# Patient Record
Sex: Male | Born: 1955 | Race: White | Hispanic: No | Marital: Married | Smoking: Never smoker
Health system: Southern US, Community
[De-identification: ages and names within clinical notes are randomized; demographics above are authoritative.]

## PROBLEM LIST (undated history)

## (undated) DIAGNOSIS — K9041 Non-celiac gluten sensitivity: Secondary | ICD-10-CM

## (undated) DIAGNOSIS — T7840XA Allergy, unspecified, initial encounter: Secondary | ICD-10-CM

## (undated) HISTORY — DX: Allergy, unspecified, initial encounter: T78.40XA

## (undated) HISTORY — PX: INGUINAL HERNIA REPAIR: SHX194

## (undated) HISTORY — DX: Non-celiac gluten sensitivity: K90.41

---

## 2005-09-24 ENCOUNTER — Ambulatory Visit: Payer: Self-pay | Admitting: Family Medicine

## 2006-01-13 ENCOUNTER — Ambulatory Visit: Payer: Self-pay | Admitting: Family Medicine

## 2006-07-13 ENCOUNTER — Ambulatory Visit: Payer: Self-pay | Admitting: Family Medicine

## 2006-07-20 ENCOUNTER — Ambulatory Visit: Payer: Self-pay | Admitting: Internal Medicine

## 2006-08-02 DIAGNOSIS — R51 Headache: Secondary | ICD-10-CM

## 2006-08-02 DIAGNOSIS — J309 Allergic rhinitis, unspecified: Secondary | ICD-10-CM

## 2006-08-02 DIAGNOSIS — R519 Headache, unspecified: Secondary | ICD-10-CM | POA: Insufficient documentation

## 2006-08-15 ENCOUNTER — Encounter: Payer: Self-pay | Admitting: Family Medicine

## 2006-08-15 ENCOUNTER — Ambulatory Visit: Payer: Self-pay | Admitting: Family Medicine

## 2006-08-15 DIAGNOSIS — J019 Acute sinusitis, unspecified: Secondary | ICD-10-CM

## 2006-08-22 ENCOUNTER — Ambulatory Visit: Payer: Self-pay | Admitting: Internal Medicine

## 2006-08-22 ENCOUNTER — Encounter (INDEPENDENT_AMBULATORY_CARE_PROVIDER_SITE_OTHER): Payer: Self-pay | Admitting: *Deleted

## 2006-08-22 LAB — HM COLONOSCOPY

## 2006-09-05 ENCOUNTER — Encounter: Payer: Self-pay | Admitting: Family Medicine

## 2006-09-05 ENCOUNTER — Ambulatory Visit: Payer: Self-pay | Admitting: Family Medicine

## 2006-09-05 DIAGNOSIS — L272 Dermatitis due to ingested food: Secondary | ICD-10-CM | POA: Insufficient documentation

## 2006-09-05 DIAGNOSIS — R1013 Epigastric pain: Secondary | ICD-10-CM

## 2006-09-05 DIAGNOSIS — K3189 Other diseases of stomach and duodenum: Secondary | ICD-10-CM

## 2006-09-07 ENCOUNTER — Encounter: Payer: Self-pay | Admitting: Family Medicine

## 2007-03-09 ENCOUNTER — Encounter: Payer: Self-pay | Admitting: Family Medicine

## 2007-06-19 ENCOUNTER — Ambulatory Visit: Payer: Self-pay | Admitting: Family Medicine

## 2007-06-19 DIAGNOSIS — K4090 Unilateral inguinal hernia, without obstruction or gangrene, not specified as recurrent: Secondary | ICD-10-CM | POA: Insufficient documentation

## 2007-08-11 ENCOUNTER — Encounter: Payer: Self-pay | Admitting: Family Medicine

## 2007-08-31 ENCOUNTER — Ambulatory Visit: Payer: Self-pay | Admitting: Family Medicine

## 2007-09-14 ENCOUNTER — Encounter: Admission: RE | Admit: 2007-09-14 | Discharge: 2007-09-14 | Payer: Self-pay | Admitting: General Surgery

## 2007-09-19 ENCOUNTER — Encounter: Payer: Self-pay | Admitting: Family Medicine

## 2007-10-12 ENCOUNTER — Encounter: Payer: Self-pay | Admitting: Family Medicine

## 2007-10-13 ENCOUNTER — Ambulatory Visit: Payer: Self-pay | Admitting: Family Medicine

## 2008-08-16 ENCOUNTER — Ambulatory Visit: Payer: Self-pay | Admitting: Family Medicine

## 2008-10-15 ENCOUNTER — Encounter: Payer: Self-pay | Admitting: Family Medicine

## 2009-09-03 ENCOUNTER — Ambulatory Visit: Payer: Self-pay | Admitting: Family Medicine

## 2010-02-09 ENCOUNTER — Encounter: Payer: Self-pay | Admitting: Family Medicine

## 2010-11-24 NOTE — Letter (Signed)
Summary: [facility]   Imported By: Lanelle Bal 02/27/2010 13:52:07  _____________________________________________________________________  External Attachment:    Type:   Image     Comment:   External Document

## 2011-03-12 NOTE — Assessment & Plan Note (Signed)
[facility]                     [place] [facility] OFFICE NOTE   DELANE, WESSINGER               MRN:          [id number]  DATE:07/20/2006                            DOB:          02/17/1956    CHIEF COMPLAINT:  Rectal bleeding.   ASSESSMENT:  13. A 55 year old white man with chronic, recurrent rectal bleeding and      hematochezia.  2. Recent problems with epigastric pain improved on Protonix.  There is      one episode of dysphagia.  3. Weight loss 16 pounds over the last several months, probably      multifactorial by with the gastrointestinal symptoms gastrointestinal      malignancy must be considered.   PLAN:  Schedule upper GI endoscopy with possible esophageal dilation as well  as colonoscopy to investigate these problems and determine a cause.  He is  to continue his short course of Protonix for the time being.  If he finds  that he requires continued proton pump inhibitor before his procedures, he  can use Prilosec OTC.   Risks, benefits, and indications of the procedure explained to the patient.  He understands and agrees to proceed.   HISTORY:  This is a 55 year old white man that says for the past two to  three years he has had intermittent blood on the toilet paper and into the  toilet bowl or with the stool.  It is an intermittent thing.  He cannot  correlate it with any particular problems with his bowels as he has none.  He does think he has hemorrhoids as he feels irritated in the anal area at  times.   Over the summer, he developed a syndrome of nausea, some fever what he  thought was a viral illness.  He had a lot of epigastric burning.  The  fevers abated.  He felt somewhat better but he had persistent  gastrointestinal symptoms.  He went to see Dr. Cathey Endow and was given Protonix  and felt much better within days.  He had a spell of dysphagia within the  past month where he was eating a breakfast  sandwich and it stopped in his  suprasternal area and he could not get it up or down but eventually it  passed.  He does not have chronic heartburn or dysphagia.  He has lost 16  pounds as noted.  There is no significant change in appetite.   PAST MEDICAL HISTORY:  1. Allergic rhinosinusitis.  2. Chronic headaches.   FAMILY HISTORY:  Father had heart disease in his 33s.  No colon cancer  reported.   SOCIAL HISTORY:  He is married.  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  Two sons, lives with his wife  and sons.  Social history also is important for that he chews tobacco.  He  is counseled to quit and he has done this intermittently for years.   MEDICATIONS:  1. Protonix 40 mg daily.  2. Nasonex daily.  3. Antihistamine nonsedating medication daily, he cannot remember the      name.  4. Tylenol as needed.  ALLERGIES:  None known.   REVIEW OF SYSTEMS:  As above.  Some low back pain and muscle cramps as well.  All other systems are negative.   PHYSICAL EXAMINATION:  Well-developed, well-nourished white man.  Height 6  feet 3.  Weight 212 pounds.  Blood pressure 121/69, pulse 81.  HEENT:  Eyes:  Anicteric.  ENT shows partial denture, otherwise free of  lesions.  NECK:  Supple.  No thyromegaly or mass.  CHEST:  Clear.  HEART:  S1-S2.  No gallops.  ABDOMEN:  Soft and nontender.  No organomegaly or mass.  RECTAL:  Deferred to colonoscopy.  EXTREMITIES:  No edema.  SKIN:  No rash.  NEUROLOGICAL:  He is alert and oriented x3.   I have ordered a CBC and CMET in this man as well.  Further plans pending  the above.   I appreciate the opportunity to care for this patient.       Iva Boop, MD,FACG      CEG/MedQ  DD:  07/20/2006  DT:  07/21/2006  Job #:  [id number]   cc:   Seymour Bars, D.O.

## 2011-04-12 ENCOUNTER — Encounter: Payer: Self-pay | Admitting: Family Medicine

## 2011-04-13 ENCOUNTER — Encounter: Payer: Self-pay | Admitting: Family Medicine

## 2011-04-13 ENCOUNTER — Ambulatory Visit (INDEPENDENT_AMBULATORY_CARE_PROVIDER_SITE_OTHER): Payer: Self-pay | Admitting: Family Medicine

## 2011-04-13 DIAGNOSIS — E039 Hypothyroidism, unspecified: Secondary | ICD-10-CM | POA: Insufficient documentation

## 2011-04-13 DIAGNOSIS — R5383 Other fatigue: Secondary | ICD-10-CM

## 2011-04-13 DIAGNOSIS — R0602 Shortness of breath: Secondary | ICD-10-CM

## 2011-04-13 NOTE — Progress Notes (Signed)
Subjective:    Patient ID: Drew Sutton, male    DOB: [birth year], 55 y.o.   MRN: [id number]  HPI Was seing a holistic doctor and has been on his thyroid med for the last 2 years.  Has been on the same regimen for 2 years. Has been feeling more fatigued lateley. He does work outdoors for about 11 hours a day.  Feels tired and worn out all the time. Mom with a history of diabets. Work has been stressful. Having a hard time getting out of bed.  Feels down. Sleeping OK.  There he says he sleeps a lot on the weekends because he tried to catch up from the week because he feels so exhausted. He has diffuse joint aches. No blood in the urine or stool. Never been a smoker.  Did work in a coal mine early in life. His wife is here with him today.   Review of Systems  BP 117/76  Pulse 82  Ht 6' 0.5" (1.842 m)  Wt 226 lb (102.513 kg)  BMI 30.23 kg/m2    No Known Allergies  Past Medical History  Diagnosis Date  . Gluten enteropathy   . Allergy     vingear    History reviewed. No pertinent past surgical history.  History   Social History  . Marital Status: Married    Spouse Name: N/A    Number of Children: N/A  . Years of Education: N/A   Occupational History  . Not on file.   Social History Main Topics  . Smoking status: Never Smoker   . Smokeless tobacco: Never Used  . Alcohol Use: No  . Drug Use: No     married, 2 children, plays golf, self employed as a Administrator  . Sexually Active:    Other Topics Concern  . Not on file   Social History Narrative  . No narrative on file    Family History  Problem Relation Age of Onset  . Heart attack Father 46  . Heart disease Mother 66    CAD  . Diabetes Mother     Current outpatient prescriptions:liothyronine (CYTOMEL) 5 MCG tablet, Take 5 mcg by mouth daily.  , Disp: , Rfl: ;  thyroid (ARMOUR) 60 MG tablet, Take 60 mg by mouth daily.  , Disp: , Rfl: ;  fexofenadine-pseudoephedrine (ALLEGRA-D) 60-120 MG per tablet, Take  1 tablet by mouth 2 (two) times daily.  , Disp: , Rfl: ;  pantoprazole (PROTONIX) 40 MG tablet, Take 40 mg by mouth daily.  , Disp: , Rfl:     Objective:   Physical Exam  Constitutional: He is oriented to person, place, and time. He appears well-developed and well-nourished.  HENT:  Head: Normocephalic and atraumatic.  Eyes: Conjunctivae are normal. Pupils are equal, round, and reactive to light.  Neck: Neck supple. No thyromegaly present.  Cardiovascular: Normal rate and regular rhythm.   Pulmonary/Chest: Effort normal and breath sounds normal.       Prolonged expiration  Lymphadenopathy:    He has no cervical adenopathy.  Neurological: He is alert and oriented to person, place, and time.  Skin: Skin is warm and dry.  Psychiatric: He has a normal mood and affect.          Assessment & Plan:  Berneta Levins has a lot of different issues that he could be causing his fatigue. He certainly works very long hours and has a very stressful job. We are going to check his thyroid to make  sure that he is therapeutic on his regimen. Also we discussed that this could be partly related to mood and stress. He did fill out the PHQ-9 for me today. Score of 9 (mild).  I also make sure he is not anemic. There is also heart disease in his family and we could consider stress testing him as well.

## 2011-04-13 NOTE — Assessment & Plan Note (Signed)
It is a little unclear to me if he truly had hyperthyroidism or if he was placed on this more as a supplement to help with energy. I would like to check his TSH to see if he is therapeutic. I certainly think he has a lot of other factors that could be causing his fatigue

## 2011-04-13 NOTE — Patient Instructions (Signed)
We will call you with your lab results 

## 2011-04-14 ENCOUNTER — Telehealth: Payer: Self-pay | Admitting: Family Medicine

## 2011-04-14 LAB — COMPLETE METABOLIC PANEL WITH GFR
AST: 13 U/L (ref 0–37)
Calcium: 9.7 mg/dL (ref 8.4–10.5)
GFR, Est African American: >60 mL/min (ref >60)
GFR, Est Non African American: >60 mL/min (ref >60)
Glucose, Bld: 86 mg/dL (ref 70–99)
Sodium: 139 mEq/L (ref 135–145)
Total Protein: 6.6 g/dL (ref 6.0–8.3)

## 2011-04-14 LAB — CBC WITH DIFFERENTIAL/PLATELET
Basophils Absolute: 0 10*3/uL (ref 0.0–0.1)
Basophils Relative: 1 % (ref 0–1)
Eosinophils Absolute: 0.3 10*3/uL (ref 0.0–0.7)
Hemoglobin: 15.2 g/dL (ref 13.0–17.0)
MCH: 29.3 pg (ref 26.0–34.0)
Monocytes Relative: 7 % (ref 3–12)

## 2011-04-14 LAB — TSH: TSH: 5.073 u[IU]/mL — ABNORMAL HIGH (ref 0.350–4.500)

## 2011-04-14 LAB — T4, FREE: Free T4: 0.99 ng/dL (ref 0.80–1.80)

## 2011-04-14 MED ORDER — THYROID 65 MG PO TABS: 65.0000 mg | ORAL_TABLET | Freq: Every day | ORAL | Status: DC

## 2011-04-14 MED ORDER — THYROID 90 MG PO TABS: 90.0000 mg | ORAL_TABLET | Freq: Every day | ORAL | Status: DC

## 2011-04-14 NOTE — Telephone Encounter (Signed)
Pt  informed of his recent lab results.  Told to increase armour thyroid to 65 mcg daily and to recheck tsh level in 6-8 weeks.  Pt told new script at the pharmacy. Jarvis Newcomer, LPN [facility]

## 2011-04-14 NOTE — Telephone Encounter (Signed)
Call patient: Complete metabolic panel and complete blood count are normal. His TSH is a little high so we actually need to increase his thyroid dose. He currently takes Armour Thyroid 60. I would like to increase it to 65 and then recheck his lab work in 6-8 weeks. I did send her for new prescription to his pharmacy.

## 2011-04-14 NOTE — Telephone Encounter (Signed)
OK, new rx sent.  

## 2011-04-14 NOTE — Telephone Encounter (Signed)
[facility] called and armour thyroid does not come in ordered dose of 65 mg.  Please advise.  We were increasing the patient, Plan:  Routed to Dr. Marlyne Beards, LPN [facility]

## 2011-04-15 NOTE — Telephone Encounter (Signed)
Pt script was re-sent by the provider. Drew Newcomer, LPN [facility]

## 2011-04-16 NOTE — Telephone Encounter (Signed)
Pt notified by [facility] that his script had been sent to his pharm. Jarvis Newcomer, LPN [facility]

## 2011-05-18 ENCOUNTER — Telehealth: Payer: Self-pay | Admitting: Family Medicine

## 2011-05-18 NOTE — Telephone Encounter (Signed)
Wife calls w/confusion on how much armour thyroid pt should be on. Confirmed to wife that it should be 90 mg daily. They picked up a Rx for 60mg  a wk ago so I called pharm and added a 30 day 30 mg Rx to add to the 60  And then a new rx for 90mg  (or 30 mg 3 daily) to the pharmacy. Wife understood and pharmacy corrected Rx. Wife will have him come in for BW after  6-8 wk on new dose.

## 2011-06-09 ENCOUNTER — Other Ambulatory Visit: Payer: Self-pay | Admitting: Family Medicine

## 2011-06-12 ENCOUNTER — Other Ambulatory Visit: Payer: Self-pay | Admitting: Family Medicine

## 2011-07-12 ENCOUNTER — Other Ambulatory Visit: Payer: Self-pay | Admitting: Family Medicine

## 2011-08-09 ENCOUNTER — Other Ambulatory Visit: Payer: Self-pay | Admitting: Family Medicine

## 2011-10-12 ENCOUNTER — Other Ambulatory Visit: Payer: Self-pay | Admitting: Family Medicine

## 2011-12-13 ENCOUNTER — Other Ambulatory Visit: Payer: Self-pay | Admitting: Family Medicine

## 2011-12-13 NOTE — Telephone Encounter (Signed)
Needs appointment

## 2011-12-15 ENCOUNTER — Other Ambulatory Visit: Payer: Self-pay | Admitting: *Deleted

## 2011-12-28 ENCOUNTER — Other Ambulatory Visit: Payer: Self-pay | Admitting: Family Medicine

## 2011-12-29 NOTE — Telephone Encounter (Signed)
Needs appointment

## 2012-02-16 ENCOUNTER — Other Ambulatory Visit: Payer: Self-pay | Admitting: Family Medicine

## 2012-03-24 ENCOUNTER — Encounter: Payer: Self-pay | Admitting: Family Medicine

## 2012-03-24 ENCOUNTER — Ambulatory Visit (INDEPENDENT_AMBULATORY_CARE_PROVIDER_SITE_OTHER): Payer: 59 | Admitting: Family Medicine

## 2012-03-24 VITALS — BP 115/70 | HR 85 | Ht 72.05 in | Wt 226.0 lb

## 2012-03-24 DIAGNOSIS — K635 Polyp of colon: Secondary | ICD-10-CM | POA: Insufficient documentation

## 2012-03-24 DIAGNOSIS — Z125 Encounter for screening for malignant neoplasm of prostate: Secondary | ICD-10-CM

## 2012-03-24 DIAGNOSIS — Z23 Encounter for immunization: Secondary | ICD-10-CM

## 2012-03-24 DIAGNOSIS — Z1211 Encounter for screening for malignant neoplasm of colon: Secondary | ICD-10-CM

## 2012-03-24 DIAGNOSIS — Z0000 Encounter for general adult medical examination without abnormal findings: Secondary | ICD-10-CM

## 2012-03-24 DIAGNOSIS — K644 Residual hemorrhoidal skin tags: Secondary | ICD-10-CM | POA: Insufficient documentation

## 2012-03-24 MED ORDER — THYROID 90 MG PO TABS: 90.0000 mg | ORAL_TABLET | Freq: Every day | ORAL | Status: DC

## 2012-03-24 NOTE — Progress Notes (Signed)
Subjective:    Patient ID: Drew Sutton, male    DOB: Oct 31, 1955, 56 y.o.   MRN: [id number]  HPI Here for CPE. Thinks might be due for repeat colonoscopy. Due for labwork.     Review of Systems Comprehensive review of systems is negative.  BP 115/70  Pulse 85  Ht 6' 0.05" (1.83 m)  Wt 226 lb (102.513 kg)  BMI 30.61 kg/m2  SpO2 97%    No Known Allergies  Past Medical History  Diagnosis Date  . Gluten enteropathy   . Allergy     vingear    Past Surgical History  Procedure Date  . Inguinal hernia repair 2008. 2010    Bilateral    History   Social History  . Marital Status: Married    Spouse Name: N/A    Number of Children: 2  . Years of Education: N/A   Occupational History  . 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    Social History Main Topics  . Smoking status: Never Smoker   . Smokeless tobacco: Never Used  . Alcohol Use: No  . Drug Use: No     married, 2 children, plays golf, self employed as a Administrator  . Sexually Active: Yes -- Male partner(s)   Other Topics Concern  . Not on file   Social History Narrative   Regular exercise. Has 2 sons.     Family History  Problem Relation Age of Onset  . Heart attack Father 19  . Heart disease Mother 2  . Diabetes Mother   . Cervical cancer Mother   . Cancer Sister     Outpatient Encounter Prescriptions as of 03/24/2012  Medication Sig Dispense Refill  . fexofenadine-pseudoephedrine (ALLEGRA-D) 60-120 MG per tablet Take 1 tablet by mouth 2 (two) times daily.        . pantoprazole (PROTONIX) 40 MG tablet Take 40 mg by mouth daily.        Marland Kitchen thyroid (ARMOUR THYROID) 90 MG tablet Take 1 tablet (90 mg total) by mouth daily.  90 tablet  1  . DISCONTD: ARMOUR THYROID 60 MG tablet TAKE 1 TABLET (65 MG TOTAL) BY MOUTH DAILY.  30 tablet  0  . DISCONTD: liothyronine (CYTOMEL) 5 MCG tablet Take 5 mcg by mouth daily.        Marland Kitchen DISCONTD: thyroid (ARMOUR THYROID) 90 MG tablet Take 1 tablet (90 mg  total) by mouth daily.  30 tablet  2  . DISCONTD: ARMOUR THYROID 30 MG tablet TAKE 3 TABLETS BY MOUTH EVERY DAY  30 tablet  0          Objective:   Physical Exam  Constitutional: He is oriented to person, place, and time. He appears well-developed and well-nourished.  HENT:  Head: Normocephalic and atraumatic.  Right Ear: External ear normal.  Left Ear: External ear normal.  Nose: Nose normal.  Mouth/Throat: Oropharynx is clear and moist.  Eyes: Conjunctivae and EOM are normal. Pupils are equal, round, and reactive to light.  Neck: Normal range of motion. Neck supple. No thyromegaly present.  Cardiovascular: Normal rate, regular rhythm, normal heart sounds and intact distal pulses.        No carotid or abdominal bruits. Dorsal pedal pulses 2+ bilaterally.  Pulmonary/Chest: Effort normal and breath sounds normal.  Abdominal: Soft. Bowel sounds are normal. He exhibits no distension and no mass. There is no tenderness. There is no rebound and no guarding.  Genitourinary:  Normal rectal tone. Several noninflamed external hemorrhoids. Prostate is mildly enlarged with no nodules.  Musculoskeletal: Normal range of motion. He exhibits no edema.  Lymphadenopathy:    He has no cervical adenopathy.  Neurological: He is alert and oriented to person, place, and time. He has normal reflexes.  Skin: Skin is warm and dry.       [place]  Psychiatric: He has a normal mood and affect. His behavior is normal. Judgment and thought content normal.          Assessment & Plan:  Start a regular exercise program and make sure you are eating a healthy diet Try to eat 4 servings of dairy a day  Your vaccines are up to date.   Tdap updated today  Recommend check with insurance company to see if the shingles vaccine is covered.  Fasting lab work recommended. Given a lab slip today.  Hypothyroid-recheck TSH. New prescriptions sent for 90 mg Armour Thyroid  Colon cancer screening-I. did go  back and his old records. He did have a colonoscopy 08/22/2006 at [facility]. He did have some polyps and it was recommended that he followup in 3 years. Thus he is overdue. He is okay with scheduling it but wants it more local. We will try schedule him at [facility] here in town.

## 2012-03-24 NOTE — Patient Instructions (Signed)
Go for labs in about 2 weeks once back on the 90 of thyroid med. We will call you to schedule you for a repeat colonoscopy. Start a regular exercise program and make sure you are eating a healthy diet Try to eat 4 servings of dairy a day. Your vaccines are up to date.  Check with your insurance and see if they will cover a shingles vaccine.

## 2012-05-03 ENCOUNTER — Other Ambulatory Visit: Payer: Self-pay | Admitting: Family Medicine

## 2012-05-03 ENCOUNTER — Encounter: Payer: Self-pay | Admitting: Physician Assistant

## 2012-05-03 ENCOUNTER — Ambulatory Visit (INDEPENDENT_AMBULATORY_CARE_PROVIDER_SITE_OTHER): Payer: 59 | Admitting: Physician Assistant

## 2012-05-03 VITALS — BP 130/83 | HR 94 | Temp 98.0°F | Ht 72.0 in | Wt 225.0 lb

## 2012-05-03 DIAGNOSIS — R5381 Other malaise: Secondary | ICD-10-CM

## 2012-05-03 DIAGNOSIS — R5383 Other fatigue: Secondary | ICD-10-CM

## 2012-05-03 DIAGNOSIS — J069 Acute upper respiratory infection, unspecified: Secondary | ICD-10-CM

## 2012-05-03 LAB — TSH: TSH: 1.147 u[IU]/mL (ref 0.350–4.500)

## 2012-05-03 MED ORDER — AMOXICILLIN-POT CLAVULANATE 875-125 MG PO TABS: 1.0000 | ORAL_TABLET | Freq: Two times a day (BID) | ORAL | Status: DC

## 2012-05-03 NOTE — Progress Notes (Signed)
  Subjective:    Patient ID: Drew Sutton, male    DOB: [birth year], 56 y.o.   MRN: [id number]  HPI Patient has been more tired for the last couple of weeks. He does work outside everyday and has had a few episodes of blurred vision and feeling nauseated. For the last week he has had right ear drainage, scratchy throat and sinus pressure. He denies headache. He has had occasional chills with productive cough. Denies fever but reports he has felt hot sometimes. Denies any CP or palpitations.     Review of Systems     Objective:   Physical Exam  Constitutional: He is oriented to person, place, and time. He appears well-developed and well-nourished.  HENT:  Head: Normocephalic and atraumatic.  Right Ear: External ear normal.  Left Ear: External ear normal.  Mouth/Throat: Oropharynx is clear and moist. No oropharyngeal exudate.       TM's normal bilaterally. Tonsils absent. Tenderness to palpation over the left maxillary and frontal sinuses. Bilateral turbinates red and swollen.   Eyes: Conjunctivae are normal.  Neck: Normal range of motion. Neck supple. No thyromegaly present.  Cardiovascular: Normal rate, regular rhythm and normal heart sounds.   Pulmonary/Chest: Effort normal and breath sounds normal. He has no wheezes.  Lymphadenopathy:    He has no cervical adenopathy.  Neurological: He is alert and oriented to person, place, and time.  Skin: Skin is warm and dry.  Psychiatric: He has a normal mood and affect. His behavior is normal.          Assessment & Plan:  URI/Fatigue- Sent to test for low testosterone. Pt had regular labs drawn this am so will add to them. We will make sure no problems with current thyroid dose. If sinus pressure and cough and headache don't improve gave Amox to start for 10 days. Start Mucinex today twice a day drinking lots of water. Continue taking Allerga-D daily. Will call with lab results.

## 2012-05-03 NOTE — Patient Instructions (Addendum)
Start regular mucinex twice a day drinking lots of water. Consider sinus rinses. If not improving by Friday night then get Amoxicillin filled and take twice a day for 10 days. Will call with lab results.   Sinusitis Sinuses are air pockets within the bones of your face. The growth of bacteria within a sinus leads to infection. The infection prevents the sinuses from draining. This infection is called sinusitis. SYMPTOMS  There will be different areas of pain depending on which sinuses have become infected.  The maxillary sinuses often produce pain beneath the eyes.   Frontal sinusitis may cause pain in the middle of the forehead and above the eyes.  Other problems (symptoms) include:  Toothaches.   Colored, pus-like (purulent) drainage from the nose.   Swelling, warmth, and tenderness over the sinus areas may be signs of infection.  TREATMENT  Sinusitis is most often determined by an exam.X-rays may be taken. If x-rays have been taken, make sure you obtain your results or find out how you are to obtain them. Your caregiver may give you medications (antibiotics). These are medications that will help kill the bacteria causing the infection. You may also be given a medication (decongestant) that helps to reduce sinus swelling.  HOME CARE INSTRUCTIONS   Only take over-the-counter or prescription medicines for pain, discomfort, or fever as directed by your caregiver.   Drink extra fluids. Fluids help thin the mucus so your sinuses can drain more easily.   Applying either moist heat or ice packs to the sinus areas may help relieve discomfort.   Use saline nasal sprays to help moisten your sinuses. The sprays can be found at your local drugstore.  SEEK IMMEDIATE MEDICAL CARE IF:  You have a fever.   You have increasing pain, severe headaches, or toothache.   You have nausea, vomiting, or drowsiness.   You develop unusual swelling around the face or trouble seeing.  MAKE SURE YOU:    Understand these instructions.   Will watch your condition.   Will get help right away if you are not doing well or get worse.  Document Released: 10/11/2005 Document Revised: 09/30/2011 Document Reviewed: 05/10/2007 [facility] Patient Information 2012 [place], [place].

## 2012-05-04 LAB — COMPLETE METABOLIC PANEL WITH GFR
AST: 18 U/L (ref 0–37)
CO2: 29 mEq/L (ref 19–32)
Chloride: 104 mEq/L (ref 96–112)
GFR, Est African American: 82 mL/min
GFR, Est Non African American: 71 mL/min
Glucose, Bld: 85 mg/dL (ref 70–99)
Potassium: 5.4 mEq/L — ABNORMAL HIGH (ref 3.5–5.3)
Sodium: 141 mEq/L (ref 135–145)
Total Bilirubin: 0.7 mg/dL (ref 0.3–1.2)

## 2012-05-04 LAB — LIPID PANEL
Cholesterol: 187 mg/dL (ref 0–200)
LDL Cholesterol: 123 mg/dL — ABNORMAL HIGH (ref 0–99)
Total CHOL/HDL Ratio: 4.5 Ratio
Triglycerides: 112 mg/dL (ref <150)

## 2012-05-05 ENCOUNTER — Telehealth: Payer: Self-pay | Admitting: *Deleted

## 2012-05-05 ENCOUNTER — Other Ambulatory Visit: Payer: Self-pay | Admitting: *Deleted

## 2012-05-05 DIAGNOSIS — Z0000 Encounter for general adult medical examination without abnormal findings: Secondary | ICD-10-CM

## 2012-05-05 MED ORDER — THYROID 90 MG PO TABS: 90.0000 mg | ORAL_TABLET | Freq: Every day | ORAL | Status: DC

## 2012-05-05 NOTE — Telephone Encounter (Signed)
Lab slip to recheck potassium

## 2012-06-18 ENCOUNTER — Other Ambulatory Visit: Payer: Self-pay | Admitting: Physician Assistant

## 2012-06-19 ENCOUNTER — Ambulatory Visit (INDEPENDENT_AMBULATORY_CARE_PROVIDER_SITE_OTHER): Payer: 59 | Admitting: Family Medicine

## 2012-06-19 ENCOUNTER — Encounter: Payer: Self-pay | Admitting: Family Medicine

## 2012-06-19 VITALS — BP 124/82 | HR 77 | Wt 225.0 lb

## 2012-06-19 DIAGNOSIS — J309 Allergic rhinitis, unspecified: Secondary | ICD-10-CM

## 2012-06-19 DIAGNOSIS — R5383 Other fatigue: Secondary | ICD-10-CM

## 2012-06-19 DIAGNOSIS — R799 Abnormal finding of blood chemistry, unspecified: Secondary | ICD-10-CM

## 2012-06-19 MED ORDER — AZELASTINE HCL 0.1 % NA SOLN: 1.0000 | Freq: Two times a day (BID) | NASAL | Status: DC

## 2012-06-19 NOTE — Progress Notes (Signed)
  Subjective:    Patient ID: Drew Sutton, male    DOB: [birth year], 56 y.o.   MRN: [id number]  HPI Had a URI about 6 week ago. Took the ABX and did feel better for about 3 weeks. In the last 3 weeks started feeling worse.  Has also been working long 12-14 hours days.  Has has been very fatigued.  No fever but not sure. Still has a cough and has been clearing the thraot. Had a migraine about 3 days ago.  HA is resolved.  Ususally use OTC meds.  No nasal conggestion.  No facial pain or pressure. No fevers. Has been on allergy meds.     Review of Systems     Objective:   Physical Exam  Constitutional: He is oriented to person, place, and time. He appears well-developed and well-nourished.  HENT:  Head: Normocephalic and atraumatic.  Right Ear: External ear normal.  Left Ear: External ear normal.  Nose: Nose normal.  Mouth/Throat: Oropharynx is clear and moist.       TMs and canals are clear.   Eyes: Conjunctivae and EOM are normal. Pupils are equal, round, and reactive to light.  Neck: Neck supple. No thyromegaly present.  Cardiovascular: Normal rate and normal heart sounds.   Pulmonary/Chest: Effort normal and breath sounds normal.  Lymphadenopathy:    He has no cervical adenopathy.  Neurological: He is alert and oriented to person, place, and time.  Skin: Skin is warm and dry.  Psychiatric: He has a normal mood and affect.          Assessment & Plan:  Fatigue - his exam is normal today and reassuring. I do not think he has any type of recurrent sinusitis. His lungs sound clear on exam. It like a CBC with differential. If the white count is normal then we'll work on trying to improve his sleep quality. He has not been sleeping well because of his work hours recently. We discussed starting melatonin to try reset his sleep cycle. His work hours are now back to normal so hopefully the next week or 2 this should improve. He recently had a normal thyroid, testosterone, CMP.  Continue allergy medication. If he is not significantly better in 2 weeks then please call the office and let me know.  Allergic rhinitis-I. do not think he has a sinus infection at this time but I will add Astelin to his oral antihistamine. He cannot use any type of steroid because of his eye condition.

## 2012-06-19 NOTE — Patient Instructions (Addendum)
Can try the nasal saline spray.  Can try melatonin for sleep Call if not better in 2 weeks.

## 2012-06-20 LAB — CBC WITH DIFFERENTIAL/PLATELET
HCT: 44.3 % (ref 39.0–52.0)
Hemoglobin: 15.1 g/dL (ref 13.0–17.0)
MCH: 29.4 pg (ref 26.0–34.0)
MCHC: 34.1 g/dL (ref 30.0–36.0)
MCV: 86.4 fL (ref 78.0–100.0)
Neutro Abs: 4.5 10*3/uL (ref 1.7–7.7)
Platelets: 218 10*3/uL (ref 150–400)
RBC: 5.13 MIL/uL (ref 4.22–5.81)
RDW: 12.9 % (ref 11.5–15.5)
WBC: 6.5 10*3/uL (ref 4.0–10.5)

## 2012-06-20 LAB — BASIC METABOLIC PANEL WITH GFR
Creat: 1.03 mg/dL (ref 0.50–1.35)
Potassium: 4.3 mEq/L (ref 3.5–5.3)

## 2012-11-16 ENCOUNTER — Other Ambulatory Visit: Payer: Self-pay | Admitting: Family Medicine

## 2013-05-10 ENCOUNTER — Ambulatory Visit (INDEPENDENT_AMBULATORY_CARE_PROVIDER_SITE_OTHER): Payer: BC Managed Care – PPO | Admitting: Family Medicine

## 2013-05-10 ENCOUNTER — Encounter: Payer: Self-pay | Admitting: Family Medicine

## 2013-05-10 VITALS — BP 122/86 | HR 83 | Temp 98.0°F | Ht 74.0 in | Wt 225.0 lb

## 2013-05-10 DIAGNOSIS — J019 Acute sinusitis, unspecified: Secondary | ICD-10-CM

## 2013-05-10 MED ORDER — AMOXICILLIN-POT CLAVULANATE 875-125 MG PO TABS: 1.0000 | ORAL_TABLET | Freq: Two times a day (BID) | ORAL | Status: AC

## 2013-05-10 NOTE — Progress Notes (Signed)
  Subjective:    Patient ID: Drew Sutton, male    DOB: June 17, 1956, 57 y.o.   MRN: [id number]  HPI Sinus painand pressure and nasal congestion x 2 weeks watery eyes, itchy ears. blowing out a lot of phlegm in the AM and is constantly clearing his throat . + dizziness.  Maybe low grade fever. No ST.  On the allegra-D.    Review of Systems     Objective:   Physical Exam  Constitutional: He is oriented to person, place, and time. He appears well-developed and well-nourished.  HENT:  Head: Normocephalic and atraumatic.  Right Ear: External ear normal.  Left Ear: External ear normal.  Nose: Nose normal.  Mouth/Throat: Oropharynx is clear and moist.  TMs and canals are clear. Tender over the maxillary sinuses  Eyes: Conjunctivae and EOM are normal. Pupils are equal, round, and reactive to light.  Neck: Neck supple. No thyromegaly present.  Cardiovascular: Normal rate, regular rhythm and normal heart sounds.   Pulmonary/Chest: Effort normal and breath sounds normal. No respiratory distress. He has no wheezes. He has no rales. He exhibits no tenderness.  Lymphadenopathy:    He has no cervical adenopathy.  Neurological: He is alert and oriented to person, place, and time.  Skin: Skin is warm and dry.  Psychiatric: He has a normal mood and affect.          Assessment & Plan:  Acute sinusitis  - will tx with Augmentin.  Call if not better in one week.  Continue allergy meds and symptomatic crae.

## 2013-05-10 NOTE — Patient Instructions (Signed)

## 2013-06-05 ENCOUNTER — Other Ambulatory Visit: Payer: Self-pay | Admitting: Family Medicine

## 2013-07-11 ENCOUNTER — Other Ambulatory Visit: Payer: Self-pay | Admitting: Family Medicine

## 2013-11-12 ENCOUNTER — Encounter: Payer: Self-pay | Admitting: Family Medicine

## 2013-11-12 ENCOUNTER — Ambulatory Visit (INDEPENDENT_AMBULATORY_CARE_PROVIDER_SITE_OTHER): Payer: BC Managed Care – PPO | Admitting: Family Medicine

## 2013-11-12 VITALS — BP 122/75 | HR 86 | Temp 97.5°F | Ht 73.0 in | Wt 232.0 lb

## 2013-11-12 DIAGNOSIS — R21 Rash and other nonspecific skin eruption: Secondary | ICD-10-CM

## 2013-11-12 DIAGNOSIS — D239 Other benign neoplasm of skin, unspecified: Secondary | ICD-10-CM

## 2013-11-12 DIAGNOSIS — J019 Acute sinusitis, unspecified: Secondary | ICD-10-CM

## 2013-11-12 DIAGNOSIS — D229 Melanocytic nevi, unspecified: Secondary | ICD-10-CM

## 2013-11-12 MED ORDER — AMOXICILLIN-POT CLAVULANATE 875-125 MG PO TABS: 1.0000 | ORAL_TABLET | Freq: Two times a day (BID) | ORAL | Status: DC

## 2013-11-12 MED ORDER — TRIAMCINOLONE ACETONIDE 0.5 % EX OINT: 1.0000 "application " | TOPICAL_OINTMENT | Freq: Two times a day (BID) | CUTANEOUS | Status: DC

## 2013-11-12 NOTE — Progress Notes (Signed)
   Subjective:    Patient ID: Drew Sutton, male    DOB: [birth year], 58 y.o.   MRN: [id number]  HPI Itchey red rash x 1 wk. 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 Has been taking salt baths and has helped.  Now on back and a few spots on his leg.    Sinus congestion with yellow x 3 weeks. Did take wifes antibiotic for 5 days.  + ST and fever. + HA. No GI sxs.  No ear pain or pressure.  Was taking mucinex D initially but not in last several day. Say got some better and now feels wrose.   Has mole on his back that his wife would like me to look at today. She says it's been gradually getting larger. Has been there for years. He says it doesn't really bother him.   Review of Systems     Objective:   Physical Exam  Constitutional: He is oriented to person, place, and time. He appears well-developed and well-nourished.  HENT:  Head: Normocephalic and atraumatic.  Right Ear: External ear normal.  Left Ear: External ear normal.  Nose: Nose normal.  Mouth/Throat: Oropharynx is clear and moist.  TMs and canals are clear.   Eyes: Conjunctivae and EOM are normal. Pupils are equal, round, and reactive to light.  Neck: Neck supple. No thyromegaly present.  Cardiovascular: Normal rate and normal heart sounds.   Pulmonary/Chest: Effort normal and breath sounds normal.  Lymphadenopathy:    He has no cervical adenopathy.  Neurological: He is alert and oriented to person, place, and time.  Skin: Skin is warm and dry.  Diffuse erythematous rash on forearms, BAck and upper chest.  Lesions on the upper chest are more papular in nature but the rash on his back and arms is macular.  Psychiatric: He has a normal mood and affect.    Atypical nevus-a lesion on his upper back is just shy of a centimeter. It's very smooth and symmetric.      Assessment & Plan:  Acute sinutis -symptoms x3 weeks. Recommend regimen to fire. Uses nasal saline rinses. He cannot use nasal steroids  because of his eye condition. Will treat with Augmentin twice a day for 10 days. If not at least partially better after 5 days and call the office back.  Rash - his rash does not appear to be consistent with poison ivy. I certainly think it could be an exanthem from his acute illness or something else going on but does not appear to be consistent with poison ivy. He is intolerant to oral steroids because of his eye condition. We will try a small quantity of topical steroid for itch relief. Call if the rash is not resolved after 5-7 days.  Atypical nevus-recommend shave biopsy since his wife reports that the lesion has been getting larger.

## 2013-11-12 NOTE — Patient Instructions (Signed)

## 2013-11-19 ENCOUNTER — Telehealth: Payer: Self-pay | Admitting: Family Medicine

## 2013-11-19 MED ORDER — TRIAMCINOLONE ACETONIDE 0.5 % EX CREA: 1.0000 "application " | TOPICAL_CREAM | Freq: Every day | CUTANEOUS | Status: DC | PRN

## 2013-11-19 NOTE — Telephone Encounter (Signed)
Pt called and the triamciniolone ointment is very sticky. Would like a cream instead. It is helping the rash.

## 2013-11-21 ENCOUNTER — Other Ambulatory Visit: Payer: Self-pay | Admitting: Family Medicine

## 2013-11-27 ENCOUNTER — Other Ambulatory Visit: Payer: Self-pay | Admitting: Family Medicine

## 2013-12-01 ENCOUNTER — Other Ambulatory Visit: Payer: Self-pay | Admitting: Family Medicine

## 2013-12-21 ENCOUNTER — Other Ambulatory Visit: Payer: Self-pay | Admitting: Family Medicine

## 2013-12-24 ENCOUNTER — Telehealth: Payer: Self-pay | Admitting: *Deleted

## 2013-12-24 DIAGNOSIS — R21 Rash and other nonspecific skin eruption: Secondary | ICD-10-CM

## 2013-12-24 NOTE — Telephone Encounter (Signed)
Pt's wife called and reports that the rash on his arms is not getting better. I asked if the triamcinolone cream is working she stated that its not as effective as it once was and he is having a lot of itching and would like a referral to derm. Referral placed.Audelia Hives [place]

## 2014-01-10 ENCOUNTER — Ambulatory Visit: Payer: BC Managed Care – PPO | Admitting: Family Medicine

## 2014-06-07 ENCOUNTER — Other Ambulatory Visit: Payer: Self-pay | Admitting: Family Medicine

## 2014-08-28 ENCOUNTER — Other Ambulatory Visit: Payer: Self-pay | Admitting: Family Medicine

## 2014-09-23 ENCOUNTER — Other Ambulatory Visit: Payer: Self-pay | Admitting: Family Medicine

## 2014-09-26 ENCOUNTER — Telehealth: Payer: Self-pay | Admitting: *Deleted

## 2014-09-26 NOTE — Telephone Encounter (Signed)
Pt called and stated that her husband's armour thyroid cost $77 and she has never paid this much for it before. She would like to know if this could be changed to something that is not as expensive. I told her to contact the insurance company and that I would fwd this to dr. Madilyn Fireman.Audelia Hives [place]

## 2014-09-27 NOTE — Telephone Encounter (Signed)
She may want to make sure they actually ran her insurnace. It would be odd the the price to change the last month of the year.   I can change him to Synthroid but would then have to get labs in 4 weeks after the change.

## 2014-10-03 NOTE — Telephone Encounter (Signed)
lvm w/recommendations.Drew Sutton  

## 2014-10-06 ENCOUNTER — Other Ambulatory Visit: Payer: Self-pay | Admitting: Family Medicine

## 2015-04-20 ENCOUNTER — Other Ambulatory Visit: Payer: Self-pay | Admitting: Family Medicine

## 2015-04-21 ENCOUNTER — Other Ambulatory Visit: Payer: Self-pay | Admitting: *Deleted

## 2015-07-02 ENCOUNTER — Other Ambulatory Visit: Payer: Self-pay | Admitting: Family Medicine

## 2015-08-29 ENCOUNTER — Encounter: Payer: Self-pay | Admitting: Family Medicine

## 2015-08-29 ENCOUNTER — Ambulatory Visit (INDEPENDENT_AMBULATORY_CARE_PROVIDER_SITE_OTHER): Payer: BLUE CROSS/BLUE SHIELD | Admitting: Family Medicine

## 2015-08-29 ENCOUNTER — Ambulatory Visit (INDEPENDENT_AMBULATORY_CARE_PROVIDER_SITE_OTHER): Payer: BLUE CROSS/BLUE SHIELD

## 2015-08-29 VITALS — BP 109/75 | HR 80 | Temp 97.7°F | Ht 73.0 in | Wt 224.5 lb

## 2015-08-29 DIAGNOSIS — Z23 Encounter for immunization: Secondary | ICD-10-CM | POA: Diagnosis not present

## 2015-08-29 DIAGNOSIS — M5136 Other intervertebral disc degeneration, lumbar region: Secondary | ICD-10-CM

## 2015-08-29 DIAGNOSIS — H3090 Unspecified chorioretinal inflammation, unspecified eye: Secondary | ICD-10-CM | POA: Insufficient documentation

## 2015-08-29 DIAGNOSIS — M545 Low back pain: Secondary | ICD-10-CM

## 2015-08-29 DIAGNOSIS — Z1159 Encounter for screening for other viral diseases: Secondary | ICD-10-CM

## 2015-08-29 DIAGNOSIS — Z114 Encounter for screening for human immunodeficiency virus [HIV]: Secondary | ICD-10-CM | POA: Diagnosis not present

## 2015-08-29 DIAGNOSIS — G8929 Other chronic pain: Secondary | ICD-10-CM

## 2015-08-29 DIAGNOSIS — Z0000 Encounter for general adult medical examination without abnormal findings: Secondary | ICD-10-CM | POA: Diagnosis not present

## 2015-08-29 DIAGNOSIS — E039 Hypothyroidism, unspecified: Secondary | ICD-10-CM | POA: Diagnosis not present

## 2015-08-29 DIAGNOSIS — R5383 Other fatigue: Secondary | ICD-10-CM

## 2015-08-29 DIAGNOSIS — H3093 Unspecified chorioretinal inflammation, bilateral: Secondary | ICD-10-CM

## 2015-08-29 DIAGNOSIS — Z1211 Encounter for screening for malignant neoplasm of colon: Secondary | ICD-10-CM

## 2015-08-29 LAB — CBC WITH DIFFERENTIAL/PLATELET
BASOS ABS: 0.1 10*3/uL (ref 0.0–0.1)
BASOS PCT: 1 % (ref 0–1)
EOS ABS: 0.4 10*3/uL (ref 0.0–0.7)
Eosinophils Relative: 8 % — ABNORMAL HIGH (ref 0–5)
HCT: 46.3 % (ref 39.0–52.0)
Hemoglobin: 15.7 g/dL (ref 13.0–17.0)
LYMPHS ABS: 1.4 10*3/uL (ref 0.7–4.0)
Lymphocytes Relative: 25 % (ref 12–46)
MCH: 29.1 pg (ref 26.0–34.0)
MCHC: 33.9 g/dL (ref 30.0–36.0)
MCV: 85.7 fL (ref 78.0–100.0)
MPV: 12.1 fL (ref 8.6–12.4)
Monocytes Absolute: 0.6 10*3/uL (ref 0.1–1.0)
Monocytes Relative: 10 % (ref 3–12)
NEUTROS ABS: 3.1 10*3/uL (ref 1.7–7.7)
NEUTROS PCT: 56 % (ref 43–77)
PLATELETS: 238 10*3/uL (ref 150–400)
RBC: 5.4 MIL/uL (ref 4.22–5.81)
RDW: 13.6 % (ref 11.5–15.5)
WBC: 5.6 10*3/uL (ref 4.0–10.5)

## 2015-08-29 MED ORDER — AMOXICILLIN-POT CLAVULANATE 875-125 MG PO TABS: 1.0000 | ORAL_TABLET | Freq: Two times a day (BID) | ORAL | Status: DC

## 2015-08-29 MED ORDER — THYROID 90 MG PO TABS: ORAL_TABLET | ORAL | Status: DC

## 2015-08-29 NOTE — Patient Instructions (Signed)
Keep up a regular exercise program and make sure you are eating a healthy diet Try to eat 4 servings of dairy a day, or if you are lactose intolerant take a calcium with vitamin D daily.  Your vaccines are up to date.   

## 2015-08-29 NOTE — Progress Notes (Signed)
Subjective:    Patient ID: Drew Sutton, male    DOB: November 09, 1955, 59 y.o.   MRN: [id number]  HPI Here for CPE.  Does not exercise outside of work but has a very physically active job. He goes for his yearly eye exams.  Today reports that he has been more fatigued and congested and with cough for 2 weeks. + runny nose. Using allegra and astelin.  He does Biomedical scientist.  No fever, chills or sweats. + sick contacts.  + SOB and wheezing.   Low back pain on and off.  Radiates into hips and legs. He denies any numbness or tingling.  Occ takes his wife tramadol.  Has tried tylenol but doesn't help much.  He has retinitis and was told not to take NSAID.   Sometimes he has other joints that are painful as well such as the shoulders or arms. Reports that his back pain has been ongoing for most of his adult life. He denies any specific trauma or injury that he remembers. 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    Review of Systems   BP 109/75 mmHg  Pulse 80  Temp(Src) 97.7 F (36.5 C)  Ht 6\' 1"  (1.854 m)  Wt 224 lb 8 oz (101.833 kg)  BMI 29.63 kg/m2    Allergies  Allergen Reactions  . Prednisone     Eye condition    Past Medical History  Diagnosis Date  . Gluten enteropathy   . Allergy     vingear    Past Surgical History  Procedure Laterality Date  . Inguinal hernia repair  2008. 2010    Bilateral    Social History   Social History  . Marital Status: Married    Spouse Name: N/A  . Number of Children: 2  . Years of Education: N/A   Occupational History  . 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    Social History Main Topics  . Smoking status: Never Smoker   . Smokeless tobacco: Never Used  . Alcohol Use: No  . Drug Use: No     Comment: married, 2 children, plays golf, self employed as a Development worker, international aid  . Sexual Activity:    Partners: Female   Other Topics Concern  . Not on file   Social History Narrative   Regular exercise. Has 2  sons. Drinks 32 ounces of soda per day.     Family History  Problem Relation Age of Onset  . Heart attack Father 9  . Heart disease Mother 47  . Diabetes Mother   . Cervical cancer Mother   . Breast cancer Sister     Outpatient Encounter Prescriptions as of 08/29/2015  Medication Sig  . ARMOUR THYROID 90 MG tablet TAKE 1 TABLET (90 MG TOTAL) BY MOUTH DAILY.  Marland Kitchen azelastine (ASTELIN) 0.1 % nasal spray Place 1 spray into both nostrils 2 (two) times daily. APPOINTMENT REQUIRED FOR FUTURE REFILLS  . fexofenadine-pseudoephedrine (ALLEGRA-D) 60-120 MG per tablet Take 1 tablet by mouth 2 (two) times daily.    . [DISCONTINUED] triamcinolone cream (KENALOG) 0.5 % APPLY 1 APPLICATION TOPICALLY DAILY AS NEEDED.  . [DISCONTINUED] thyroid (ARMOUR THYROID) 90 MG tablet Take 1 tablet by mouth daily **APPOINTMENT NECESSARY FOR FURTHER REFILLS**   No facility-administered encounter medications on file as of 08/29/2015.          Objective:   Physical Exam  Constitutional: He is oriented to person, place, and time. He  appears well-developed and well-nourished.  HENT:  Head: Normocephalic and atraumatic.  Right Ear: External ear normal.  Left Ear: External ear normal.  Nose: Nose normal.  Mouth/Throat: Oropharynx is clear and moist.  Eyes: Conjunctivae and EOM are normal. Pupils are equal, round, and reactive to light.  Neck: Normal range of motion. Neck supple. No thyromegaly present.  Cardiovascular: Normal rate, regular rhythm, normal heart sounds and intact distal pulses.   Pulmonary/Chest: Effort normal and breath sounds normal.  Abdominal: Soft. Bowel sounds are normal. He exhibits no distension and no mass. There is no tenderness. There is no rebound and no guarding.  Musculoskeletal: Normal range of motion.  Lumbar spine with normal flexion, surgeon, rotation right and left, side bending. Hip, knee, ankle strict is 5 out of 5 bilaterally. Patellar reflexes are 2+ bilaterally. Negative  straight leg raise bilaterally. Nontender of the lumbar spine. Nontender of the bilateral SI joints. No significant hip pain with internal and external rotation.  Lymphadenopathy:    He has no cervical adenopathy.  Neurological: He is alert and oriented to person, place, and time. He has normal reflexes.  Skin: Skin is warm and dry.  Psychiatric: He has a normal mood and affect. His behavior is normal. Judgment and thought content normal.           Assessment & Plan:  CPE -  Keep up a regular exercise program and make sure you are eating a healthy diet Try to eat 4 servings of dairy a day, or if you are lactose intolerant take a calcium with vitamin D daily.  Your vaccines are up to date.  Flu vaccine given today. Due for lab work including screening for HIV and hepatitis C.  Acute sinusitis - will treat with Augmentin. Please call if not significantly better in one week. Okay to continue symptomatic care.  Chronic low back pain - it sounds like his pain has been ongoing for most of his adult life that has been worse over the last year. He denies any radicular symptoms. We'll start with the x-ray today. If it confirms some significant arthritis then we can consider adding tramadol as needed for pain relief since he is unable to take NSAIDs. These are contraindicated because of his retinitis.  Hypothyroid - he has been of fhis thyroid medication for the 6 months.  reilled his medication.

## 2015-08-30 LAB — COMPLETE METABOLIC PANEL WITH GFR
ALT: 13 U/L (ref 9–46)
AST: 14 U/L (ref 10–35)
Albumin: 4.3 g/dL (ref 3.6–5.1)
Alkaline Phosphatase: 77 U/L (ref 40–115)
BUN: 13 mg/dL (ref 7–25)
CHLORIDE: 104 mmol/L (ref 98–110)
CO2: 30 mmol/L (ref 20–31)
CREATININE: 0.99 mg/dL (ref 0.70–1.25)
Calcium: 9.1 mg/dL (ref 8.6–10.3)
GFR, Est Non African American: 82 mL/min (ref >=60)
GLUCOSE: 87 mg/dL (ref 65–99)
POTASSIUM: 4.3 mmol/L (ref 3.5–5.3)
SODIUM: 139 mmol/L (ref 135–146)
TOTAL PROTEIN: 6.4 g/dL (ref 6.1–8.1)
Total Bilirubin: 0.8 mg/dL (ref 0.2–1.2)

## 2015-08-30 LAB — LIPID PANEL
Cholesterol: 172 mg/dL (ref 125–200)
HDL: 42 mg/dL (ref >=40)
LDL Cholesterol: 94 mg/dL (ref <130)
Total CHOL/HDL Ratio: 4.1 Ratio (ref <=5.0)
Triglycerides: 178 mg/dL — ABNORMAL HIGH (ref <150)
VLDL: 36 mg/dL — ABNORMAL HIGH (ref <30)

## 2015-08-30 LAB — HIV ANTIBODY (ROUTINE TESTING W REFLEX): HIV 1&2 Ab, 4th Generation: NONREACTIVE

## 2015-08-30 LAB — PSA: PSA: 3.82 ng/mL (ref <=4.00)

## 2015-08-30 LAB — HEPATITIS C ANTIBODY: HCV AB: NEGATIVE

## 2015-08-30 LAB — TSH: TSH: 2.082 u[IU]/mL (ref 0.350–4.500)

## 2015-08-31 NOTE — Addendum Note (Signed)
Addended by: Beatrice Lecher D on: 08/31/2015 08:07 PM   Modules accepted: Orders

## 2015-09-01 ENCOUNTER — Encounter: Payer: Self-pay | Admitting: Family Medicine

## 2015-09-01 MED ORDER — TRAMADOL HCL 50 MG PO TABS: 50.0000 mg | ORAL_TABLET | Freq: Three times a day (TID) | ORAL | Status: DC | PRN

## 2015-12-04 ENCOUNTER — Other Ambulatory Visit: Payer: Self-pay | Admitting: Family Medicine

## 2016-01-06 ENCOUNTER — Ambulatory Visit (INDEPENDENT_AMBULATORY_CARE_PROVIDER_SITE_OTHER): Payer: BLUE CROSS/BLUE SHIELD | Admitting: Family Medicine

## 2016-01-06 ENCOUNTER — Encounter: Payer: Self-pay | Admitting: Family Medicine

## 2016-01-06 VITALS — BP 127/93 | HR 93 | Wt 231.0 lb

## 2016-01-06 DIAGNOSIS — M5431 Sciatica, right side: Secondary | ICD-10-CM

## 2016-01-06 DIAGNOSIS — M5430 Sciatica, unspecified side: Secondary | ICD-10-CM | POA: Insufficient documentation

## 2016-01-06 MED ORDER — TRAMADOL HCL 50 MG PO TABS: 50.0000 mg | ORAL_TABLET | Freq: Three times a day (TID) | ORAL | Status: DC | PRN

## 2016-01-06 MED ORDER — GABAPENTIN 300 MG PO CAPS: ORAL_CAPSULE | ORAL | Status: DC

## 2016-01-06 NOTE — Progress Notes (Signed)
   Subjective:    I'm seeing this patient as a consultation for:  Dr. Madilyn Fireman  CC: Right leg pain  HPI: Patient has severe right leg pain present for the last 3 days. Patient denies any specific injury. He denies any weakness. He does note some tingling sensation to the plantar foot but denies any significant numbness. No bowel bladder dysfunction. Pain is worse with activity and flexion of the back. He has tried some over-the-counter medicines other than somewhat helpful.   Past medical history, Surgical history, Family history not pertinant except as noted below, Social history, Allergies, and medications have been entered into the medical record, reviewed, and no changes needed.   Review of Systems: No headache, visual changes, nausea, vomiting, diarrhea, constipation, dizziness, abdominal pain, skin rash, fevers, chills, night sweats, weight loss, swollen lymph nodes, body aches, joint swelling, muscle aches, chest pain, shortness of breath, mood changes, visual or auditory hallucinations.   Objective:    Filed Vitals:   01/06/16 1311  BP: 127/93  Pulse: 93   General: Well Developed, well nourished, and in no acute distress.  Neuro/Psych: Alert and oriented x3, extra-ocular muscles intact, able to move all 4 extremities, sensation grossly intact. Skin: Warm and dry, no rashes noted.  Respiratory: Not using accessory muscles, speaking in full sentences, trachea midline.  Cardiovascular: Pulses palpable, no extremity edema. Abdomen: Does not appear distended. MSK: Back is nontender to spinal midline. Mildly tender to palpation bilateral SI joints. Back motion is limited in flexion and extension by pain. Hip is nontender with normal motion Knee  is nontender with normal motion Thigh and calf are nontender throughout. Knee extension and flexion strength is equal and normal bilaterally Positive slump test right negative left. Reflexes are equal and normal bilateral knees and  ankles. Largely strength is intact and normal throughout. Sensation is intact and normal throughout bilateral lower extremities.  X-ray L-spine dating 08/29/2015 reviewed  No results found for this or any previous visit (from the past 24 hour(s)). No results found.  Impression and Recommendations:   This case required medical decision making of moderate complexity.

## 2016-01-06 NOTE — Assessment & Plan Note (Signed)
Patient very likely has radicular pain. However he cannot take any steroids due to his history of retinitis. This is a bit of a conundrum. Plan physical therapy and gabapentin. Return a few weeks if not better would likely obtain MRI

## 2016-01-06 NOTE — Patient Instructions (Signed)
Thank you for coming in today. Attend PT.  Return in 2-3 weeks.  Take gabapentin and tramadol.  Come back or go to the emergency room if you notice new weakness new numbness problems walking or bowel or bladder problems.  Sciatica Sciatica is pain, weakness, numbness, or tingling along the path of the sciatic nerve. The nerve starts in the lower back and runs down the back of each leg. The nerve controls the muscles in the lower leg and in the back of the knee, while also providing sensation to the back of the thigh, lower leg, and the sole of your foot. Sciatica is a symptom of another medical condition. For instance, nerve damage or certain conditions, such as a herniated disk or bone spur on the spine, pinch or put pressure on the sciatic nerve. This causes the pain, weakness, or other sensations normally associated with sciatica. Generally, sciatica only affects one side of the body. CAUSES   Herniated or slipped disc.  Degenerative disk disease.  A pain disorder involving the narrow muscle in the buttocks (piriformis syndrome).  Pelvic injury or fracture.  Pregnancy.  Tumor (rare). SYMPTOMS  Symptoms can vary from mild to very severe. The symptoms usually travel from the low back to the buttocks and down the back of the leg. Symptoms can include:  Mild tingling or dull aches in the lower back, leg, or hip.  Numbness in the back of the calf or sole of the foot.  Burning sensations in the lower back, leg, or hip.  Sharp pains in the lower back, leg, or hip.  Leg weakness.  Severe back pain inhibiting movement. These symptoms may get worse with coughing, sneezing, laughing, or prolonged sitting or standing. Also, being overweight may worsen symptoms. DIAGNOSIS  Your caregiver will perform a physical exam to look for common symptoms of sciatica. He or she may ask you to do certain movements or activities that would trigger sciatic nerve pain. Other tests may be performed to find  the cause of the sciatica. These may include:  Blood tests.  X-rays.  Imaging tests, such as an MRI or CT scan. TREATMENT  Treatment is directed at the cause of the sciatic pain. Sometimes, treatment is not necessary and the pain and discomfort goes away on its own. If treatment is needed, your caregiver may suggest:  Over-the-counter medicines to relieve pain.  Prescription medicines, such as anti-inflammatory medicine, muscle relaxants, or narcotics.  Applying heat or ice to the painful area.  Steroid injections to lessen pain, irritation, and inflammation around the nerve.  Reducing activity during periods of pain.  Exercising and stretching to strengthen your abdomen and improve flexibility of your spine. Your caregiver may suggest losing weight if the extra weight makes the back pain worse.  Physical therapy.  Surgery to eliminate what is pressing or pinching the nerve, such as a bone spur or part of a herniated disk. HOME CARE INSTRUCTIONS   Only take over-the-counter or prescription medicines for pain or discomfort as directed by your caregiver.  Apply ice to the affected area for 20 minutes, 3-4 times a day for the first 48-72 hours. Then try heat in the same way.  Exercise, stretch, or perform your usual activities if these do not aggravate your pain.  Attend physical therapy sessions as directed by your caregiver.  Keep all follow-up appointments as directed by your caregiver.  Do not wear high heels or shoes that do not provide proper support.  Check your mattress to  see if it is too soft. A firm mattress may lessen your pain and discomfort. SEEK IMMEDIATE MEDICAL CARE IF:   You lose control of your bowel or bladder (incontinence).  You have increasing weakness in the lower back, pelvis, buttocks, or legs.  You have redness or swelling of your back.  You have a burning sensation when you urinate.  You have pain that gets worse when you lie down or awakens  you at night.  Your pain is worse than you have experienced in the past.  Your pain is lasting longer than 4 weeks.  You are suddenly losing weight without reason. MAKE SURE YOU:  Understand these instructions.  Will watch your condition.  Will get help right away if you are not doing well or get worse.   This information is not intended to replace advice given to you by your health care provider. Make sure you discuss any questions you have with your health care provider.   Document Released: 10/05/2001 Document Revised: 07/02/2015 Document Reviewed: 02/20/2012 Elsevier Interactive Patient Education [employer].

## 2016-01-15 ENCOUNTER — Other Ambulatory Visit: Payer: Self-pay | Admitting: Family Medicine

## 2016-01-16 ENCOUNTER — Other Ambulatory Visit: Payer: Self-pay

## 2016-01-16 MED ORDER — TRAMADOL HCL 50 MG PO TABS: 50.0000 mg | ORAL_TABLET | Freq: Three times a day (TID) | ORAL | Status: DC | PRN

## 2016-01-20 ENCOUNTER — Ambulatory Visit: Payer: BLUE CROSS/BLUE SHIELD | Admitting: Family Medicine

## 2016-01-29 ENCOUNTER — Ambulatory Visit (INDEPENDENT_AMBULATORY_CARE_PROVIDER_SITE_OTHER): Payer: BLUE CROSS/BLUE SHIELD | Admitting: Family Medicine

## 2016-01-29 DIAGNOSIS — Z5329 Procedure and treatment not carried out because of patient's decision for other reasons: Secondary | ICD-10-CM

## 2016-01-29 NOTE — Progress Notes (Signed)
No show. F/u PRN

## 2016-02-09 ENCOUNTER — Encounter: Payer: Self-pay | Admitting: Family Medicine

## 2016-02-09 ENCOUNTER — Other Ambulatory Visit: Payer: Self-pay | Admitting: Family Medicine

## 2016-02-09 ENCOUNTER — Ambulatory Visit (INDEPENDENT_AMBULATORY_CARE_PROVIDER_SITE_OTHER): Payer: BLUE CROSS/BLUE SHIELD | Admitting: Family Medicine

## 2016-02-09 VITALS — BP 132/82 | HR 114 | Wt 228.0 lb

## 2016-02-09 DIAGNOSIS — J0100 Acute maxillary sinusitis, unspecified: Secondary | ICD-10-CM

## 2016-02-09 DIAGNOSIS — J302 Other seasonal allergic rhinitis: Secondary | ICD-10-CM | POA: Diagnosis not present

## 2016-02-09 MED ORDER — AMOXICILLIN-POT CLAVULANATE 875-125 MG PO TABS: 1.0000 | ORAL_TABLET | Freq: Two times a day (BID) | ORAL | Status: DC

## 2016-02-09 MED ORDER — MONTELUKAST SODIUM 10 MG PO TABS: 10.0000 mg | ORAL_TABLET | Freq: Every day | ORAL | Status: DC

## 2016-02-09 MED ORDER — TRAMADOL HCL 50 MG PO TABS: 50.0000 mg | ORAL_TABLET | Freq: Three times a day (TID) | ORAL | Status: DC | PRN

## 2016-02-09 NOTE — Progress Notes (Signed)
   Subjective:    Patient ID: Drew Sutton, male    DOB: 1956-03-29, 60 y.o.   MRN: [id number]  HPI Sinusitis x several weeks. facial pain and pressure, congestion, itchy eyes, headache at the base of head, low grade fevers, throat clearing,neck and R ear pain he gets winded sometimes.  He's also felt a little dizzy at times with it. Had watery itchy eyes and a lot of itching in his throat as well. He does take Claritin-D every morning. He is intolerant to steroids including topical steroids.     Review of Systems     Objective:   Physical Exam  Constitutional: He is oriented to person, place, and time. He appears well-developed and well-nourished.  HENT:  Head: Normocephalic and atraumatic.  Right Ear: External ear normal.  Left Ear: External ear normal.  Nose: Nose normal.  Mouth/Throat: Oropharynx is clear and moist.  TMs and canals are clear.   Eyes: Conjunctivae and EOM are normal. Pupils are equal, round, and reactive to light.  Neck: Neck supple. No thyromegaly present.  Cardiovascular: Normal rate and normal heart sounds.   Pulmonary/Chest: Effort normal and breath sounds normal.  Lymphadenopathy:    He has no cervical adenopathy.  Neurological: He is alert and oriented to person, place, and time.  Skin: Skin is warm and dry.  Psychiatric: He has a normal mood and affect.          Assessment & Plan:  Acute sinusitis complicated with AR - Will go ahead and treat with Augmentin for 10 days. Call if not significantly better after one week. Discussed options to add to his antihistamine. We'll try Singulair since he is unable to tolerate oral or topical steroids.

## 2016-02-27 ENCOUNTER — Ambulatory Visit: Payer: BLUE CROSS/BLUE SHIELD | Admitting: Family Medicine

## 2016-03-09 ENCOUNTER — Other Ambulatory Visit: Payer: Self-pay | Admitting: Family Medicine

## 2016-03-19 ENCOUNTER — Encounter: Payer: Self-pay | Admitting: Family Medicine

## 2016-03-19 ENCOUNTER — Ambulatory Visit (INDEPENDENT_AMBULATORY_CARE_PROVIDER_SITE_OTHER): Payer: BLUE CROSS/BLUE SHIELD | Admitting: Family Medicine

## 2016-03-19 ENCOUNTER — Ambulatory Visit (INDEPENDENT_AMBULATORY_CARE_PROVIDER_SITE_OTHER): Payer: BLUE CROSS/BLUE SHIELD

## 2016-03-19 VITALS — BP 120/72 | HR 78 | Wt 228.0 lb

## 2016-03-19 DIAGNOSIS — M5489 Other dorsalgia: Secondary | ICD-10-CM | POA: Diagnosis not present

## 2016-03-19 DIAGNOSIS — M546 Pain in thoracic spine: Secondary | ICD-10-CM | POA: Diagnosis not present

## 2016-03-19 NOTE — Progress Notes (Signed)
Subjective:    CC: Back Pain  HPI: Her for mid back pain In the midthoracic area. It has been going on for well over a year, probably for couple years. The pain radiates to the area between the shoulder blades. He says the right side is worse than the left and most of his pain is centralized over the spine. He says at times it feels tight. He feels like the muscles are most become knotted. He does take the tramadol and it does help. Evidently the prescription that we send in April was never received by the pharmacy so he has not been using it. 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  Pain is 7/10. The tramadol helps.   Past medical history, Surgical history, Family history not pertinant except as noted below, Social history, Allergies, and medications have been entered into the medical record, reviewed, and corrections made.   Review of Systems: No fevers, chills, night sweats, weight loss, chest pain, or shortness of breath.   Objective:    General: Well Developed, well nourished, and in no acute distress.  Neuro: Alert and oriented x3, extra-ocular muscles intact, sensation grossly intact.  HEENT: Normocephalic, atraumatic  Skin: Warm and dry, no rashes. Cardiac: Regular rate and rhythm, no murmurs rubs or gallops, no lower extremity edema.  Respiratory: Clear to auscultation bilaterally. Not using accessory muscles, speaking in full sentences.  Nontender over the thoracic spine. He does have knotted paraspinous muscles on each side and rhomboids toward the scapulas bilaterally. Shoulders with normal range of motion. Lumbar spine with normal flexion, oxygen, rotation right and left and side bending. Hip, knee, ankle strength is 5 out of 5 bilaterally. Patellar reflexes 2+ bilaterally.   Impression and Recommendations:   Thoracic mid back pain-did refill tramadol today but discussed that we also need to look  at long-term treatments as well. Will start with getting a thoracic spine film. We discussed getting physical therapy as well. Discussed the presence of doing this now and learning techniques to really deal with his muscle spasm and tightness and pain besides just using pain medication. This month as his busiest season of the year so maybe we could schedule it later this summer but I did encourage him to consider it for the long haul for treating his back pain.

## 2016-05-19 ENCOUNTER — Other Ambulatory Visit: Payer: Self-pay | Admitting: *Deleted

## 2016-05-19 MED ORDER — MONTELUKAST SODIUM 10 MG PO TABS: 10.0000 mg | ORAL_TABLET | Freq: Every day | ORAL | 1 refills | Status: DC

## 2016-08-26 ENCOUNTER — Other Ambulatory Visit: Payer: Self-pay | Admitting: Family Medicine

## 2016-10-03 ENCOUNTER — Other Ambulatory Visit: Payer: Self-pay | Admitting: Family Medicine

## 2016-11-03 ENCOUNTER — Other Ambulatory Visit: Payer: Self-pay | Admitting: Family Medicine

## 2016-11-27 ENCOUNTER — Other Ambulatory Visit: Payer: Self-pay | Admitting: Family Medicine

## 2016-12-08 ENCOUNTER — Ambulatory Visit (INDEPENDENT_AMBULATORY_CARE_PROVIDER_SITE_OTHER): Payer: BLUE CROSS/BLUE SHIELD | Admitting: Physician Assistant

## 2016-12-08 ENCOUNTER — Encounter: Payer: Self-pay | Admitting: Physician Assistant

## 2016-12-08 VITALS — BP 122/81 | HR 105 | Temp 97.5°F | Ht 73.0 in | Wt 228.0 lb

## 2016-12-08 DIAGNOSIS — R05 Cough: Secondary | ICD-10-CM

## 2016-12-08 DIAGNOSIS — J0191 Acute recurrent sinusitis, unspecified: Secondary | ICD-10-CM

## 2016-12-08 DIAGNOSIS — R059 Cough, unspecified: Secondary | ICD-10-CM

## 2016-12-08 MED ORDER — AMOXICILLIN-POT CLAVULANATE 875-125 MG PO TABS: 1.0000 | ORAL_TABLET | Freq: Two times a day (BID) | ORAL | 0 refills | Status: DC

## 2016-12-08 MED ORDER — AZITHROMYCIN 250 MG PO TABS: ORAL_TABLET | ORAL | 0 refills | Status: DC

## 2016-12-08 MED ORDER — BENZONATATE 200 MG PO CAPS: 200.0000 mg | ORAL_CAPSULE | Freq: Two times a day (BID) | ORAL | 0 refills | Status: DC | PRN

## 2016-12-08 NOTE — Patient Instructions (Signed)

## 2016-12-08 NOTE — Progress Notes (Addendum)
Subjective:     Patient ID: Drew Sutton, male   DOB: [birth year], 61 y.o.   MRN: [id number]  HPI  This is a 61 year old male with a history of chronic sinusitis who presents for evaluation of cold like symptoms x 1 week. He endorses sinus pain and pressure, eye pain, cough, sneezing, rhinorrhea, sore throat, chest tightness, shortness of breath, ear pain, and diarrhea. Denies muscle aches, dyspnea on exertion. Has taken alka seltzer and Mucinex without relief. Denies recent exposure to influenza.  Review of Systems All other ROS negative except those noted in the HPI.    Objective:   Physical Exam  Constitutional: He is oriented to person, place, and time. He appears well-developed and well-nourished. No distress.  HENT:  Head: Normocephalic and atraumatic.  Right Ear: Tympanic membrane is erythematous. Tympanic membrane is not bulging.  Left Ear: Tympanic membrane is erythematous.  Nose: Rhinorrhea present.  Mouth/Throat: No oropharyngeal exudate.  Eyes: Right eye exhibits no discharge. Left eye exhibits no discharge.  Neck: Normal range of motion. Neck supple. No tracheal deviation present. No thyromegaly present.  Cardiovascular: Normal rate, regular rhythm and normal heart sounds.  Exam reveals no gallop and no friction rub.   No murmur heard. Pulmonary/Chest: Effort normal and breath sounds normal. He has no wheezes. He has no rales. He exhibits no tenderness.  Abdominal: Soft. Bowel sounds are normal. There is no tenderness. There is no rebound and no guarding.  Lymphadenopathy:    He has no cervical adenopathy.  Neurological: He is alert and oriented to person, place, and time.  Skin: Skin is warm and dry.  Psychiatric: He has a normal mood and affect. His behavior is normal.      Assessment/Plan:  Diagnoses and all orders for this visit:  Acute recurrent sinusitis, unspecified location -     azithromycin (ZITHROMAX) 250 MG tablet; Take 2 tablets now and then one  tablet for 4 days.  Cough -     benzonatate (TESSALON) 200 MG capsule; Take 1 capsule (200 mg total) by mouth 2 (two) times daily as needed for cough. -     azithromycin (ZITHROMAX) 250 MG tablet; Take 2 tablets now and then one tablet for 4 days.  Other orders -     Discontinue: amoxicillin-clavulanate (AUGMENTIN) 875-125 MG tablet; Take 1 tablet by mouth 2 (two) times daily. For 10 days.  Given that patient has been experiencing symptoms for longer than 10 days and has failed over the counter therapy, Azithromycin was prescribed to treat sinusitis. Pt had abdominal cramping with augmentin last time he had sinusitis.  Encouraged patient to rest, stay hydrated, take hot steam baths, and use Astelin as needed for nasal congestion. Instructed patient to return to clinic if condition does not improve or worsens.

## 2016-12-12 IMAGING — CR DG LUMBAR SPINE COMPLETE 4+V
5 series · 5 of 5 positions shown · non-contrast
Comparison: None in PACs

CLINICAL DATA: Chronic low back pain without known injury

EXAM:
LUMBAR SPINE - COMPLETE 4+ VIEW

[l-spine ap]
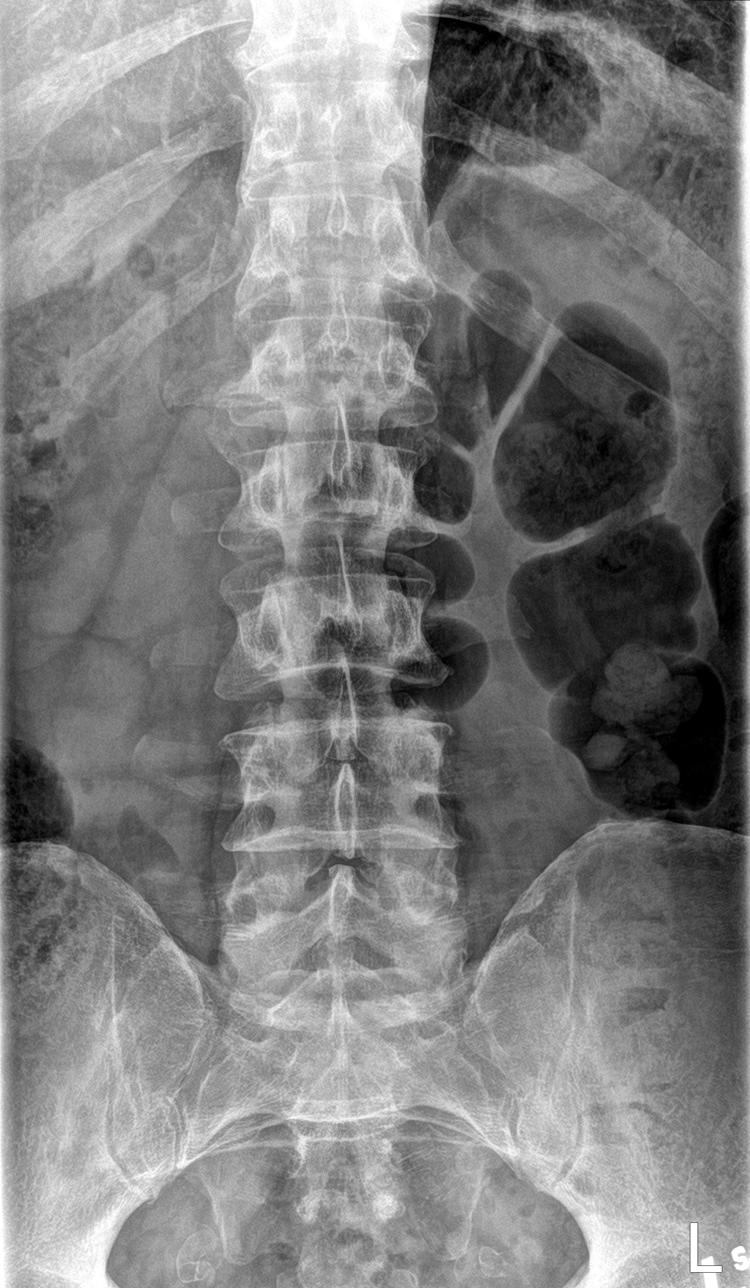

[l-spine obl (1 of 2)]
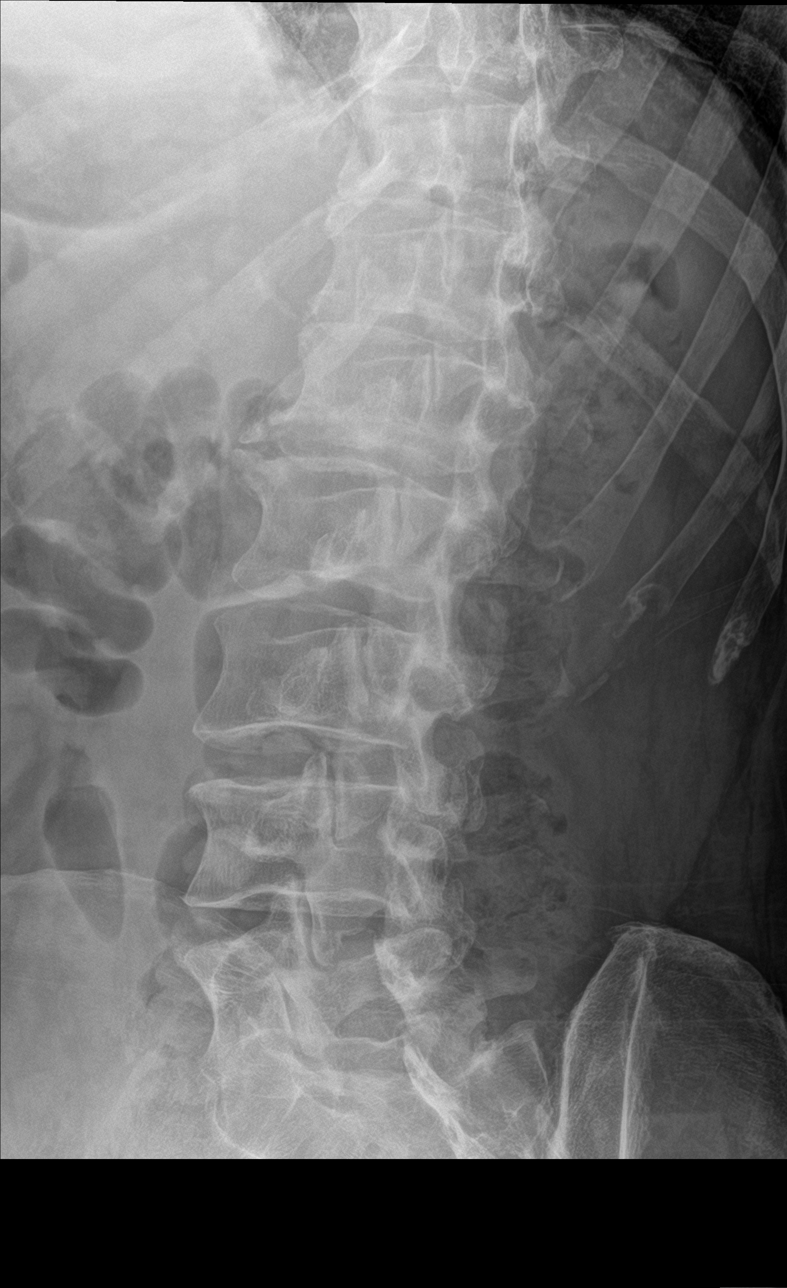

[l-spine obl (2 of 2)]
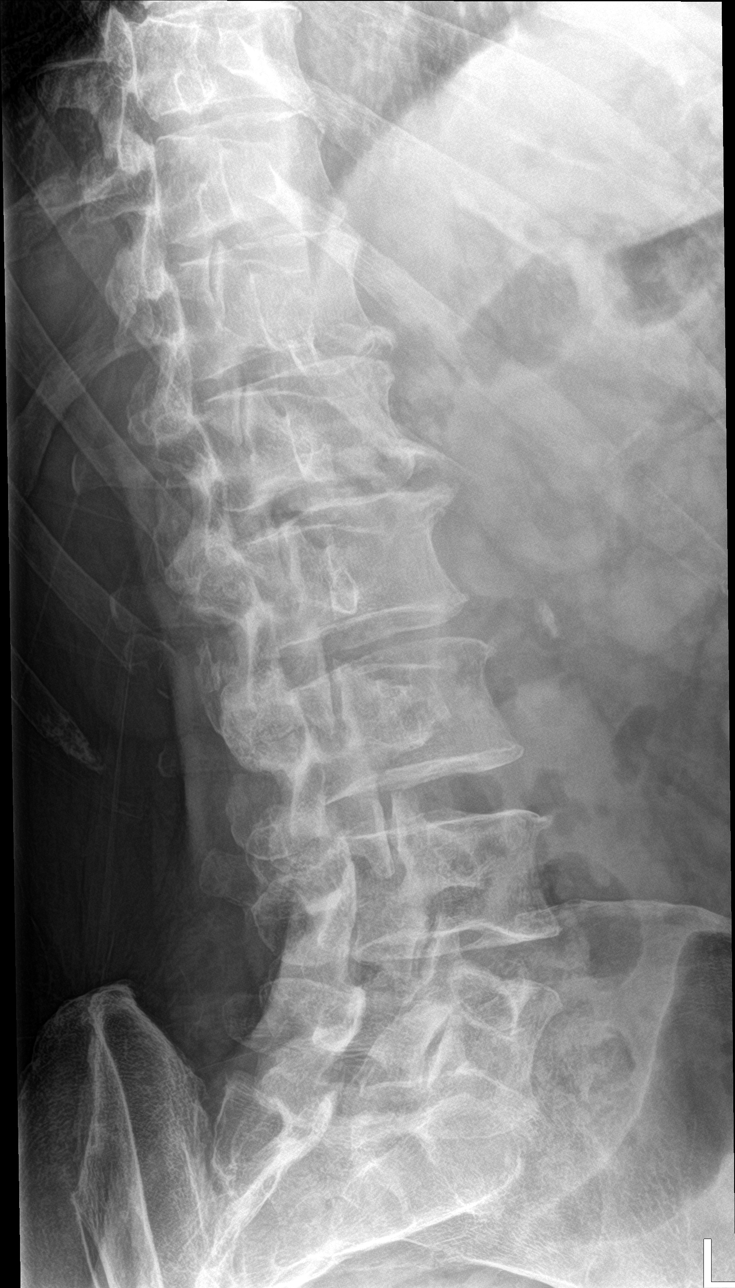

[l-spine lat]
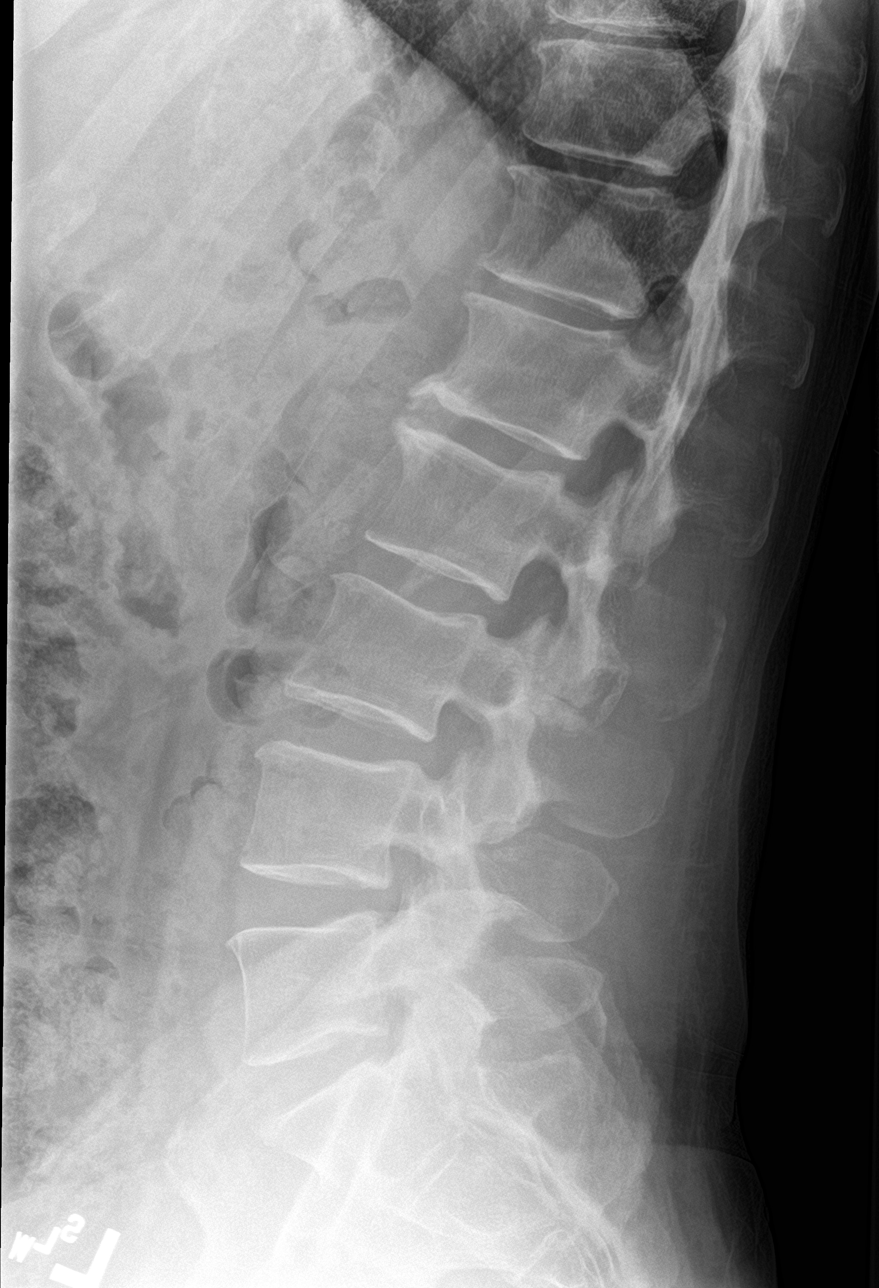

[l-spine spot]
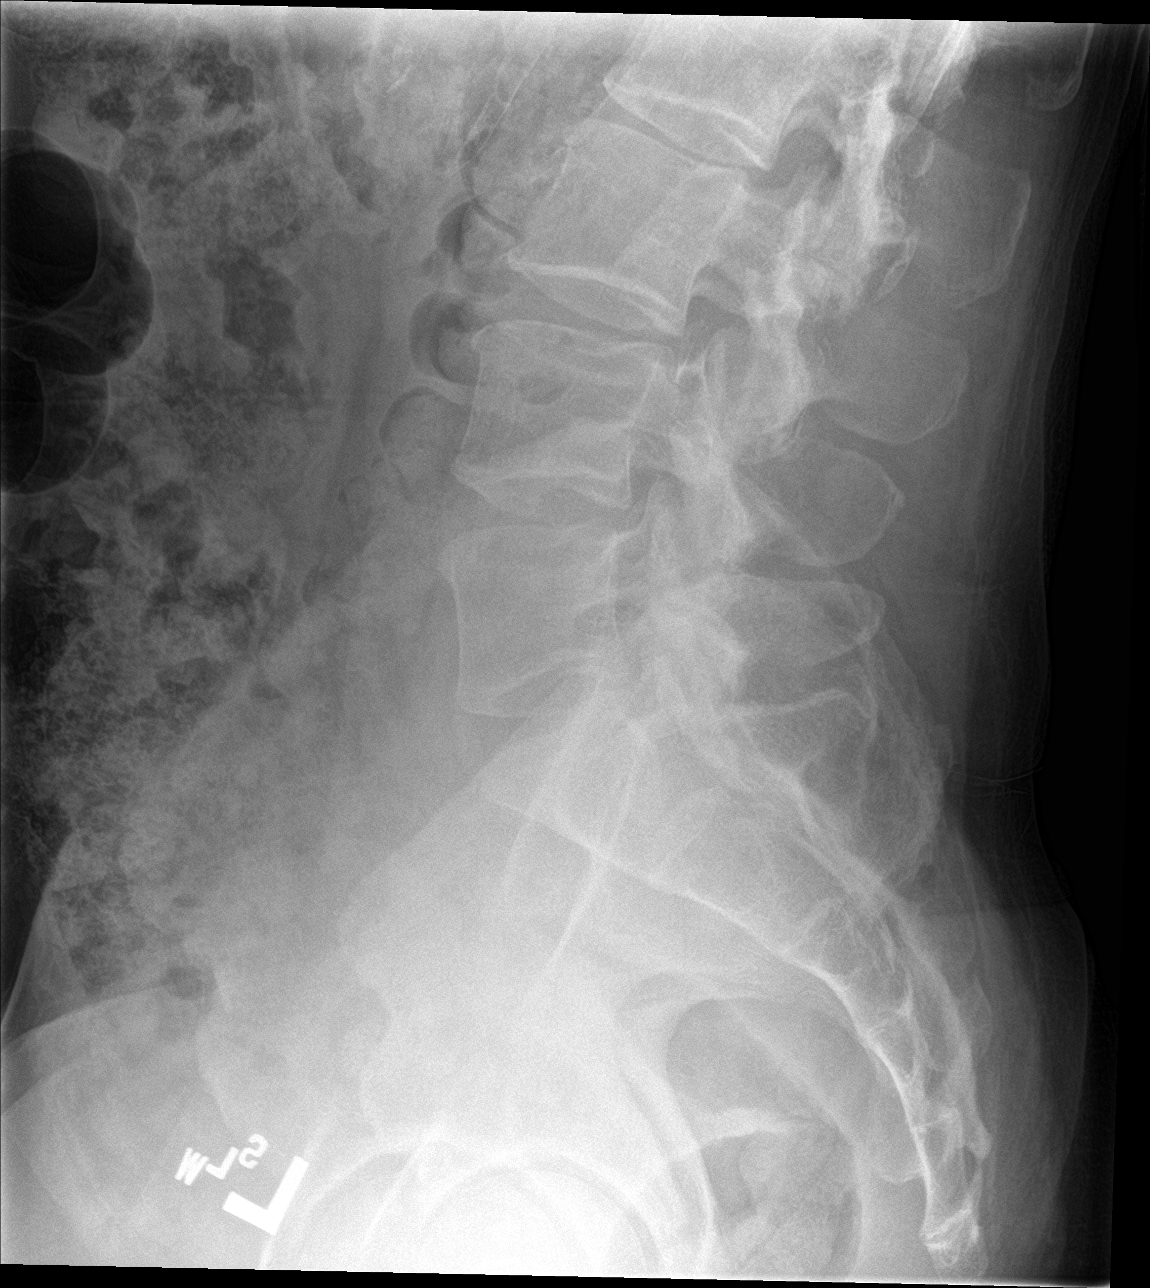

[5 of 5 positions shown; findings below may reference images not displayed]

FINDINGS: The lumbar vertebral bodies are preserved in height. The pedicles
and transverse processes are intact. There is mild degenerative disc
disease centered at L1-2 with prominent anterior endplate
osteophytes. There is no significant facet joint hypertrophy.
IMPRESSION: Degenerative disc disease centered at L1-2. There is no compression
fracture nor other acute bony abnormality.

## 2016-12-29 ENCOUNTER — Other Ambulatory Visit: Payer: Self-pay | Admitting: Family Medicine

## 2017-03-02 ENCOUNTER — Other Ambulatory Visit: Payer: Self-pay | Admitting: *Deleted

## 2017-03-02 MED ORDER — AZELASTINE HCL 0.1 % NA SOLN: 1.0000 | Freq: Two times a day (BID) | NASAL | 1 refills | Status: DC

## 2017-03-02 MED ORDER — THYROID 90 MG PO TABS: 90.0000 mg | ORAL_TABLET | Freq: Every day | ORAL | 0 refills | Status: DC

## 2017-03-08 ENCOUNTER — Other Ambulatory Visit: Payer: Self-pay | Admitting: Family Medicine

## 2017-04-06 ENCOUNTER — Ambulatory Visit: Payer: BLUE CROSS/BLUE SHIELD | Admitting: Physician Assistant

## 2017-04-08 ENCOUNTER — Encounter: Payer: Self-pay | Admitting: Family Medicine

## 2017-04-08 ENCOUNTER — Ambulatory Visit (INDEPENDENT_AMBULATORY_CARE_PROVIDER_SITE_OTHER): Payer: BLUE CROSS/BLUE SHIELD | Admitting: Family Medicine

## 2017-04-08 VITALS — BP 125/67 | HR 102 | Ht 73.23 in | Wt 223.0 lb

## 2017-04-08 DIAGNOSIS — Z125 Encounter for screening for malignant neoplasm of prostate: Secondary | ICD-10-CM | POA: Diagnosis not present

## 2017-04-08 DIAGNOSIS — R0789 Other chest pain: Secondary | ICD-10-CM | POA: Diagnosis not present

## 2017-04-08 DIAGNOSIS — J0130 Acute sphenoidal sinusitis, unspecified: Secondary | ICD-10-CM

## 2017-04-08 DIAGNOSIS — R42 Dizziness and giddiness: Secondary | ICD-10-CM

## 2017-04-08 DIAGNOSIS — R5383 Other fatigue: Secondary | ICD-10-CM

## 2017-04-08 DIAGNOSIS — E039 Hypothyroidism, unspecified: Secondary | ICD-10-CM

## 2017-04-08 DIAGNOSIS — Z9114 Patient's other noncompliance with medication regimen: Secondary | ICD-10-CM | POA: Diagnosis not present

## 2017-04-08 MED ORDER — AZITHROMYCIN 250 MG PO TABS: ORAL_TABLET | ORAL | 0 refills | Status: AC

## 2017-04-08 MED ORDER — THYROID 90 MG PO TABS: 90.0000 mg | ORAL_TABLET | Freq: Every day | ORAL | 0 refills | Status: DC

## 2017-04-08 NOTE — Progress Notes (Addendum)
Subjective:    Patient ID: Drew Sutton, male    DOB: 09-09-1956, 61 y.o.   MRN: [id number]  HPI 61 year old male comes in today feeling that his thyroid is off. He has a diagnosis of hypothyroidism and was taking Armour Thyroid 90 mg daily.He ran out about a month ago. Though per his wife it's been more like 2 or 3 months. Last lab was checked over a year ago. Lab Results  Component Value Date   TSH 2.082 08/29/2015  More recently, over the last few weeks, he is just felt very tired. He's feeling dizzy and lightheaded. 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 He says he tries to stay hydrated but it's really hard. He said the order he's gotten the [employer] is to work summers there. Yesterday he woke up feeling some chest tightness and feeling almost anxious. Almost like a panic attack. He he said he's never felt like that before. He felt a tingling in his arms as well around that time. He's been having intermittent low-grade headaches and pressure behind his eyes. Last week he actually missed 3 days of work in this week 2 days of work. He has had a temperature for 101.7. Decreased appetite and low energy. He also complains of "brain fog"  He says he really feels like his has a sinus infection. Especially with the fever fatigue and facial pain and pressure. Normally he gets severe allergies around this time and he does work outdoors. He says he takes Allegra-D every day.  Review of Systems   BP 125/67   Pulse (!) 102   Ht 6' 1.23" (1.86 m)   Wt 223 lb (101.2 kg)   SpO2 98%   BMI 29.24 kg/m     Allergies  Allergen Reactions  . Nsaids Other (See Comments)    Eye condition, retinitis  . Prednisone     Eye condition    Past Medical History:  Diagnosis Date  . Allergy    vingear  . Gluten enteropathy     Past Surgical History:  Procedure Laterality Date  . INGUINAL HERNIA REPAIR   2008. 2010   Bilateral    Social History   Social History  . Marital status: Married    Spouse name: N/A  . Number of children: 2  . Years of education: N/A   Occupational History  . 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    Social History Main Topics  . Smoking status: Never Smoker  . Smokeless tobacco: Never Used  . Alcohol use No  . Drug use: No     Comment: married, 2 children, plays golf, self employed as a Development worker, international aid  . Sexual activity: Yes    Partners: Female   Other Topics Concern  . Not on file   Social History Narrative   Regular exercise. Has 2 sons. Drinks 32 ounces of soda per day.     Family History  Problem Relation Age of Onset  . Heart attack Father 33  . Heart disease Mother 51  . Diabetes Mother   . Cervical cancer Mother   . Breast cancer Sister     Outpatient Encounter Prescriptions as of 04/08/2017  Medication Sig  . azelastine (ASTELIN) 0.1 % nasal spray Place 1 spray into both nostrils 2 (two) times daily.  Marland Kitchen azithromycin (ZITHROMAX) 250 MG tablet 2 Ttabs PO  on Day 1, then one a day x 4 days.  . fexofenadine-pseudoephedrine (ALLEGRA-D) 60-120 MG per tablet Take 1 tablet by mouth 2 (two) times daily.    Marland Kitchen thyroid (ARMOUR THYROID) 90 MG tablet Take 1 tablet (90 mg total) by mouth daily.  . [DISCONTINUED] gabapentin (NEURONTIN) 300 MG capsule One tab PO qHS for a week, then BID for a week, then TID. May double weekly to a max of 3,600mg /day  . [DISCONTINUED] thyroid (ARMOUR THYROID) 90 MG tablet Take 1 tablet (90 mg total) by mouth daily. 2 WEEK SUPPLY GIVEN. THIS IS THE LAST REFILL. LABS/OFFICE VISIT REQUIRED FOR ADDITIONAL REFILLS   No facility-administered encounter medications on file as of 04/08/2017.           Objective:   Physical Exam  Constitutional: He is oriented to person, place, and time. He appears well-developed and well-nourished.  HENT:  Head: Normocephalic and atraumatic.  Right Ear: External ear normal.  Left Ear:  External ear normal.  Nose: Nose normal.  Mouth/Throat: Oropharynx is clear and moist.  TMs and canals are clear.   Eyes: Conjunctivae and EOM are normal. Pupils are equal, round, and reactive to light.  Neck: Neck supple. No thyromegaly present.  Cardiovascular: Normal rate and normal heart sounds.   Pulmonary/Chest: Effort normal and breath sounds normal.  Lymphadenopathy:    He has no cervical adenopathy.  Neurological: He is alert and oriented to person, place, and time.  Skin: Skin is warm and dry.  Psychiatric: He has a normal mood and affect.       Assessment & Plan:  Hypothyroid - Will restart his Armour Thyroid and check his TSH. I note will be off but I want to see where we are starting from. That way we can recheck it in about 6-8 weeks.  Dizziness - negative orthostatics.  Check for Anemia.  Atypical chest pain-certainly could be related to increased anxiety and being off thyroid medication but we'll go ahead and do an EKG today as well. EKG shows Rate of 82 bpm, normal sinus rhythm with normal axis and no acute ST-T wave changes.  Due for PSA.  Possible sinusitis-the I'm not sure some of his symptoms are related to his thyroid or not but he has been running a fever. Can fill a perception for azithromycin if not improving.

## 2017-04-11 ENCOUNTER — Other Ambulatory Visit: Payer: Self-pay | Admitting: Family Medicine

## 2017-04-11 LAB — CBC WITH DIFFERENTIAL/PLATELET
BASOS ABS: 0 {cells}/uL (ref 0–200)
BASOS PCT: 0 %
EOS ABS: 237 {cells}/uL (ref 15–500)
EOS PCT: 3 %
HCT: 46.6 % (ref 38.5–50.0)
HEMOGLOBIN: 15.4 g/dL (ref 13.2–17.1)
LYMPHS ABS: 1659 {cells}/uL (ref 850–3900)
Lymphocytes Relative: 21 %
MCH: 29 pg (ref 27.0–33.0)
MCHC: 33 g/dL (ref 32.0–36.0)
MCV: 87.8 fL (ref 80.0–100.0)
MONOS PCT: 7 %
MPV: 10.4 fL (ref 7.5–12.5)
Monocytes Absolute: 553 cells/uL (ref 200–950)
NEUTROS ABS: 5451 {cells}/uL (ref 1500–7800)
Neutrophils Relative %: 69 %
PLATELETS: 370 10*3/uL (ref 140–400)
RBC: 5.31 MIL/uL (ref 4.20–5.80)
RDW: 13.8 % (ref 11.0–15.0)
WBC: 7.9 10*3/uL (ref 3.8–10.8)

## 2017-04-12 LAB — COMPREHENSIVE METABOLIC PANEL
ALBUMIN: 4 g/dL (ref 3.6–5.1)
ALK PHOS: 86 U/L (ref 40–115)
ALT: 12 U/L (ref 9–46)
AST: 10 U/L (ref 10–35)
BILIRUBIN TOTAL: 0.5 mg/dL (ref 0.2–1.2)
BUN: 16 mg/dL (ref 7–25)
CO2: 27 mmol/L (ref 20–31)
CREATININE: 1.07 mg/dL (ref 0.70–1.25)
Calcium: 9.2 mg/dL (ref 8.6–10.3)
Chloride: 104 mmol/L (ref 98–110)
Glucose, Bld: 96 mg/dL (ref 65–99)
Potassium: 4.2 mmol/L (ref 3.5–5.3)
SODIUM: 140 mmol/L (ref 135–146)
TOTAL PROTEIN: 6.2 g/dL (ref 6.1–8.1)

## 2017-04-12 LAB — PSA: PSA: 4.9 ng/mL — AB (ref <=4.0)

## 2017-04-12 LAB — TSH: TSH: 6.39 m[IU]/L — AB (ref 0.40–4.50)

## 2017-04-13 LAB — T4: T4 TOTAL: 8.3 ug/dL (ref 4.5–12.0)

## 2017-04-13 LAB — T3: T3 TOTAL: 84 ng/dL (ref 76–181)

## 2017-04-13 LAB — THYROID PEROXIDASE ANTIBODY: Thyroperoxidase Ab SerPl-aCnc: 42 IU/mL — ABNORMAL HIGH (ref <9)

## 2017-04-13 LAB — TESTOSTERONE: TESTOSTERONE: 451 ng/dL (ref 250–827)

## 2017-04-14 ENCOUNTER — Telehealth: Payer: Self-pay | Admitting: *Deleted

## 2017-04-14 DIAGNOSIS — R972 Elevated prostate specific antigen [PSA]: Secondary | ICD-10-CM

## 2017-04-14 NOTE — Telephone Encounter (Signed)
Order for psa free and total ordered and faxed.Drew Sutton, Drew Sutton

## 2017-04-15 ENCOUNTER — Other Ambulatory Visit: Payer: Self-pay | Admitting: *Deleted

## 2017-04-15 DIAGNOSIS — R972 Elevated prostate specific antigen [PSA]: Secondary | ICD-10-CM

## 2017-04-18 LAB — PSA, TOTAL AND FREE
PSA, % Free: 10 % — ABNORMAL LOW (ref >25)
PSA, Free: 0.6 ng/mL
PSA, TOTAL: 5.9 ng/mL — AB (ref <=4.0)

## 2017-04-30 NOTE — Addendum Note (Signed)
Addended by: Narda Rutherford on: 04/30/2017 08:28 AM   Modules accepted: Orders

## 2017-05-13 ENCOUNTER — Telehealth: Payer: Self-pay | Admitting: Family Medicine

## 2017-05-13 DIAGNOSIS — E039 Hypothyroidism, unspecified: Secondary | ICD-10-CM

## 2017-05-13 DIAGNOSIS — R972 Elevated prostate specific antigen [PSA]: Secondary | ICD-10-CM

## 2017-05-13 NOTE — Telephone Encounter (Signed)
I called and spoke with his wife. I rescheduled the appointment for early August and ordered the labs. Patient's wife is aware.

## 2017-05-13 NOTE — Telephone Encounter (Signed)
Patient has a f/up visit on 05/20/17 at 4:20pm with [facility] and need to have PSA and Thyroid labs recheck'd and would like to know if he can get a lab order sent Monday 7/23 or Tues 7/24 so he can go over the results in appointment. Please adv. Thanks

## 2017-05-20 ENCOUNTER — Ambulatory Visit: Payer: Self-pay | Admitting: Family Medicine

## 2017-05-31 ENCOUNTER — Ambulatory Visit: Payer: BLUE CROSS/BLUE SHIELD | Admitting: Family Medicine

## 2017-07-02 ENCOUNTER — Other Ambulatory Visit: Payer: Self-pay | Admitting: Family Medicine

## 2017-08-06 ENCOUNTER — Other Ambulatory Visit: Payer: Self-pay | Admitting: Family Medicine

## 2017-08-08 ENCOUNTER — Other Ambulatory Visit: Payer: Self-pay | Admitting: Family Medicine

## 2017-08-17 ENCOUNTER — Telehealth: Payer: Self-pay | Admitting: *Deleted

## 2017-08-17 DIAGNOSIS — R972 Elevated prostate specific antigen [PSA]: Secondary | ICD-10-CM

## 2017-08-17 DIAGNOSIS — E039 Hypothyroidism, unspecified: Secondary | ICD-10-CM

## 2017-08-17 MED ORDER — THYROID 90 MG PO TABS: 90.0000 mg | ORAL_TABLET | Freq: Every day | ORAL | 0 refills | Status: DC

## 2017-08-17 NOTE — Telephone Encounter (Signed)
Pt's wife called and asked if his labs can be faxed. Labs ordered and she was reminded to call to schedule an appt.Audelia Hives Swisher

## 2017-08-27 LAB — TSH: TSH: 0.51 mIU/L (ref 0.40–4.50)

## 2017-08-27 LAB — PSA: PSA: 3.4 ng/mL (ref <=4.0)

## 2017-09-17 ENCOUNTER — Other Ambulatory Visit: Payer: Self-pay | Admitting: Family Medicine

## 2017-09-27 ENCOUNTER — Encounter: Payer: Self-pay | Admitting: Physician Assistant

## 2017-09-27 ENCOUNTER — Ambulatory Visit (INDEPENDENT_AMBULATORY_CARE_PROVIDER_SITE_OTHER): Payer: Self-pay | Admitting: Physician Assistant

## 2017-09-27 VITALS — BP 116/70 | HR 91 | Temp 97.6°F | Ht 73.23 in | Wt 238.0 lb

## 2017-09-27 DIAGNOSIS — J0141 Acute recurrent pansinusitis: Secondary | ICD-10-CM

## 2017-09-27 MED ORDER — AZITHROMYCIN 250 MG PO TABS: ORAL_TABLET | ORAL | 0 refills | Status: DC

## 2017-09-27 MED ORDER — AZELASTINE HCL 0.1 % NA SOLN: 1.0000 | Freq: Two times a day (BID) | NASAL | 11 refills | Status: DC

## 2017-09-27 NOTE — Patient Instructions (Signed)
Consider adding Singulair. Consider CT of sinuses due to recurrent nature.    Sinusitis, Adult Sinusitis is soreness and inflammation of your sinuses. Sinuses are hollow spaces in the bones around your face. They are located:  Around your eyes.  In the middle of your forehead.  Behind your nose.  In your cheekbones.  Your sinuses and nasal passages are lined with a stringy fluid (mucus). Mucus normally drains out of your sinuses. When your nasal tissues get inflamed or swollen, the mucus can get trapped or blocked so air cannot flow through your sinuses. This lets bacteria, viruses, and funguses grow, and that leads to infection. Follow these instructions at home: Medicines  Take, use, or apply over-the-counter and prescription medicines only as told by your doctor. These may include nasal sprays.  If you were prescribed an antibiotic medicine, take it as told by your doctor. Do not stop taking the antibiotic even if you start to feel better. Hydrate and Humidify  Drink enough water to keep your pee (urine) clear or pale yellow.  Use a cool mist humidifier to keep the humidity level in your home above 50%.  Breathe in steam for 10-15 minutes, 3-4 times a day or as told by your doctor. You can do this in the bathroom while a hot shower is running.  Try not to spend time in cool or dry air. Rest  Rest as much as possible.  Sleep with your head raised (elevated).  Make sure to get enough sleep each night. General instructions  Put a warm, moist washcloth on your face 3-4 times a day or as told by your doctor. This will help with discomfort.  Wash your hands often with soap and water. If there is no soap and water, use hand sanitizer.  Do not smoke. Avoid being around people who are smoking (secondhand smoke).  Keep all follow-up visits as told by your doctor. This is important. Contact a doctor if:  You have a fever.  Your symptoms get worse.  Your symptoms do not  get better within 10 days. Get help right away if:  You have a very bad headache.  You cannot stop throwing up (vomiting).  You have pain or swelling around your face or eyes.  You have trouble seeing.  You feel confused.  Your neck is stiff.  You have trouble breathing. This information is not intended to replace advice given to you by your health care provider. Make sure you discuss any questions you have with your health care provider. Document Released: 03/29/2008 Document Revised: 06/06/2016 Document Reviewed: 08/06/2015 Elsevier Interactive Patient Education  [employer].

## 2017-09-27 NOTE — Progress Notes (Addendum)
   Subjective:    Patient ID: Drew Sutton, male    DOB: 13-Jan-1956, 61 y.o.   MRN: [id number]  HPI Drew Sutton is a 61 y/o male with recurrent sinusitis who presents with symptoms of sinusitis.   He states that the symptoms began around Thanksgiving and reports sore throat, rhinorrhea, headache, eye pain, sinus pain, cough, post nasal drip, tinnitus in the left ear. He denies ear pain, fever, and chills. He takes Allegra-D daily for recurrent sinus problems and azelastine nasal spray. He has never had imaging or been to ENT.   .. Active Ambulatory Problems    Diagnosis Date Noted  . RHINITIS, ALLERGIC 08/02/2006  . ALLERGY, FOOD 09/05/2006  . HEADACHE, UNSPECIFIED 08/02/2006  . Hypothyroid 04/13/2011  . External hemorrhoid 03/24/2012  . Colon polyps 03/24/2012  . Retinitis 08/29/2015  . Sciatica 01/06/2016   Resolved Ambulatory Problems    Diagnosis Date Noted  . SINUSITIS, ACUTE 08/15/2006  . DYSPEPSIA 09/05/2006  . INGUINAL HERNIA, LEFT 06/19/2007   Past Medical History:  Diagnosis Date  . Allergy   . Gluten enteropathy      Review of Systems  Constitutional: Negative for chills and fever.  HENT: Positive for congestion, postnasal drip, rhinorrhea, sinus pressure, sinus pain, sore throat and tinnitus. Negative for ear pain.   Eyes: Positive for pain.  Respiratory: Positive for cough.   Neurological: Positive for headaches.      Objective:   Physical Exam  Constitutional: He is oriented to person, place, and time. He appears well-developed and well-nourished.  HENT:  Head: Normocephalic and atraumatic.  Right Ear: Tympanic membrane, external ear and ear canal normal.  Left Ear: Tympanic membrane, external ear and ear canal normal.  Nose: Rhinorrhea present. Right sinus exhibits maxillary sinus tenderness and frontal sinus tenderness. Left sinus exhibits maxillary sinus tenderness and frontal sinus tenderness.  Mouth/Throat: Uvula is midline, oropharynx is  clear and moist and mucous membranes are normal. No oropharyngeal exudate, posterior oropharyngeal edema or posterior oropharyngeal erythema.  Cardiovascular: Normal rate, regular rhythm and normal heart sounds.  Pulmonary/Chest: Effort normal and breath sounds normal. He has no wheezes. He has no rales.  Lymphadenopathy:    He has no cervical adenopathy.  Neurological: He is alert and oriented to person, place, and time.  Skin: Skin is warm and dry.  Psychiatric: He has a normal mood and affect. His behavior is normal. Thought content normal.  Vitals reviewed.     Assessment & Plan:  .Marland KitchenMarland KitchenDiagnoses and all orders for this visit:  Acute recurrent pansinusitis -     azithromycin (ZITHROMAX) 250 MG tablet; Take 2 tablets now and then one tablet for 4 days. -     azelastine (ASTELIN) 0.1 % nasal spray; Place 1 spray into both nostrils 2 (two) times daily.   Acute recurrent pansinusitis - Because the patient has been experiencing symptoms of sinusitis for greater than 10 days his infection is likely bacterial, prescribed Azithromycin. Discussed with patient following up with ENT for CT scan of his sinuses to rule out pathological cause of his recurrent sinusitis or chronic sinusitis. Also advised patient to consider taking Singular in combination with his daily Allegra-D for extra relief. Pt states he will think about this and discuss with PCP.  Refilled Azelastine because patient reported that he uses this daily and is running out.

## 2017-10-20 ENCOUNTER — Other Ambulatory Visit: Payer: Self-pay | Admitting: Family Medicine

## 2017-10-28 ENCOUNTER — Other Ambulatory Visit: Payer: Self-pay | Admitting: Family Medicine

## 2017-11-21 ENCOUNTER — Telehealth: Payer: Self-pay | Admitting: Family Medicine

## 2017-11-21 NOTE — Telephone Encounter (Signed)
Letter written for jury duty.

## 2018-01-05 ENCOUNTER — Telehealth: Payer: Self-pay | Admitting: Family Medicine

## 2018-01-05 NOTE — Telephone Encounter (Signed)
Left pt a Vm for him to call and schedule a f/u appt with Dr. Madilyn Fireman for thyroid

## 2018-04-19 ENCOUNTER — Other Ambulatory Visit: Payer: Self-pay | Admitting: Family Medicine

## 2018-05-06 ENCOUNTER — Other Ambulatory Visit: Payer: Self-pay | Admitting: Family Medicine

## 2018-05-16 ENCOUNTER — Other Ambulatory Visit: Payer: Self-pay

## 2018-05-16 MED ORDER — THYROID 90 MG PO TABS: 90.0000 mg | ORAL_TABLET | Freq: Every day | ORAL | 0 refills | Status: DC

## 2018-06-10 ENCOUNTER — Other Ambulatory Visit: Payer: Self-pay | Admitting: Family Medicine

## 2018-06-15 ENCOUNTER — Other Ambulatory Visit: Payer: Self-pay | Admitting: Family Medicine

## 2018-06-15 MED ORDER — THYROID 90 MG PO TABS: 90.0000 mg | ORAL_TABLET | Freq: Every day | ORAL | 0 refills | Status: DC

## 2018-06-15 NOTE — Progress Notes (Signed)
Sent to his wife that he needs to make an appointment I did go ahead and fill a 30-day supply but he needs understand he has not been seen for his chronic problems in over a year.

## 2018-07-11 ENCOUNTER — Other Ambulatory Visit: Payer: Self-pay | Admitting: Family Medicine

## 2018-07-13 ENCOUNTER — Encounter: Payer: Self-pay | Admitting: Family Medicine

## 2018-07-13 ENCOUNTER — Ambulatory Visit (INDEPENDENT_AMBULATORY_CARE_PROVIDER_SITE_OTHER): Payer: Self-pay | Admitting: Family Medicine

## 2018-07-13 VITALS — BP 107/55 | HR 81 | Temp 98.6°F | Ht 73.0 in | Wt 214.2 lb

## 2018-07-13 DIAGNOSIS — J0100 Acute maxillary sinusitis, unspecified: Secondary | ICD-10-CM

## 2018-07-13 DIAGNOSIS — E039 Hypothyroidism, unspecified: Secondary | ICD-10-CM

## 2018-07-13 MED ORDER — AMOXICILLIN-POT CLAVULANATE 875-125 MG PO TABS: 1.0000 | ORAL_TABLET | Freq: Two times a day (BID) | ORAL | 0 refills | Status: DC

## 2018-07-13 NOTE — Progress Notes (Signed)
   Subjective:    Patient ID: Drew Sutton, male    DOB: [birth year], 62 y.o.   MRN: [id number]  HPI  62 yo male with hypothyroidism: Hypothyroidism - Taking medication regularly in the AM away from food and vitamins, etc. No recent change to hair, or energy levels.  Is constantly fatigued.  He has noticed an occasional red scaly rash on his scalp but has been trying to change shampoos etc.  He thinks is because the use well water.  He also reports that for 2 weeks he has had posterior headaches as well as significant nasal congestion with postnasal drip and sore throat.  He says he has a pretty decent cough first thing in the morning when he wakes up.  Is been feeling a little dizzy with it.  Is been getting some maxillary sinus pressure with it.      Review of Systems     Objective:   Physical Exam  Constitutional: He is oriented to person, place, and time. He appears well-developed and well-nourished.  HENT:  Head: Normocephalic and atraumatic.  Right Ear: External ear normal.  Left Ear: External ear normal.  Nose: Nose normal.  Mouth/Throat: Oropharynx is clear and moist.  TMs and canals are clear.   Eyes: Pupils are equal, round, and reactive to light. Conjunctivae and EOM are normal.  Neck: Neck supple. No thyromegaly present.  Cardiovascular: Normal rate, regular rhythm and normal heart sounds.  Pulmonary/Chest: Effort normal and breath sounds normal.  Lymphadenopathy:    He has no cervical adenopathy.  Neurological: He is alert and oriented to person, place, and time.  Skin: Skin is warm and dry.  Psychiatric: He has a normal mood and affect. His behavior is normal.        Assessment & Plan:  Hypothyroidism -check TSH.  If normal then refill medications  Acute sinusitis-we will treat with Augmentin.  Call if not better in 1 week.

## 2018-07-14 LAB — TSH: TSH: 0.93 mIU/L (ref 0.40–4.50)

## 2018-07-17 ENCOUNTER — Other Ambulatory Visit: Payer: Self-pay | Admitting: Family Medicine

## 2018-07-18 ENCOUNTER — Other Ambulatory Visit: Payer: Self-pay | Admitting: *Deleted

## 2018-07-18 MED ORDER — THYROID 90 MG PO TABS: ORAL_TABLET | ORAL | 1 refills | Status: DC

## 2018-07-31 ENCOUNTER — Other Ambulatory Visit: Payer: Self-pay | Admitting: Family Medicine

## 2018-08-09 ENCOUNTER — Other Ambulatory Visit: Payer: Self-pay | Admitting: *Deleted

## 2019-01-02 ENCOUNTER — Ambulatory Visit (INDEPENDENT_AMBULATORY_CARE_PROVIDER_SITE_OTHER): Payer: Self-pay | Admitting: Physician Assistant

## 2019-01-02 ENCOUNTER — Encounter: Payer: Self-pay | Admitting: Physician Assistant

## 2019-01-02 VITALS — BP 116/69 | HR 79 | Temp 97.8°F | Wt 244.0 lb

## 2019-01-02 DIAGNOSIS — J0141 Acute recurrent pansinusitis: Secondary | ICD-10-CM

## 2019-01-02 DIAGNOSIS — J3089 Other allergic rhinitis: Secondary | ICD-10-CM

## 2019-01-02 DIAGNOSIS — J302 Other seasonal allergic rhinitis: Secondary | ICD-10-CM

## 2019-01-02 MED ORDER — IPRATROPIUM BROMIDE 0.06 % NA SOLN: 2.0000 | Freq: Four times a day (QID) | NASAL | 12 refills | Status: DC

## 2019-01-02 MED ORDER — DOXYCYCLINE HYCLATE 100 MG PO TABS: 100.0000 mg | ORAL_TABLET | Freq: Two times a day (BID) | ORAL | 0 refills | Status: DC

## 2019-01-02 MED ORDER — CETIRIZINE-PSEUDOEPHEDRINE ER 5-120 MG PO TB12: 1.0000 | ORAL_TABLET | Freq: Two times a day (BID) | ORAL | 11 refills | Status: DC

## 2019-01-02 NOTE — Patient Instructions (Addendum)
Neilmed sinus wash.   Sinusitis, Adult Sinusitis is soreness and swelling (inflammation) of your sinuses. Sinuses are hollow spaces in the bones around your face. They are located:  Around your eyes.  In the middle of your forehead.  Behind your nose.  In your cheekbones. Your sinuses and nasal passages are lined with a fluid called mucus. Mucus drains out of your sinuses. Swelling can trap mucus in your sinuses. This lets germs (bacteria, virus, or fungus) grow, which leads to infection. Most of the time, this condition is caused by a virus. What are the causes? This condition is caused by:  Allergies.  Asthma.  Germs.  Things that block your nose or sinuses.  Growths in the nose (nasal polyps).  Chemicals or irritants in the air.  Fungus (rare). What increases the risk? You are more likely to develop this condition if:  You have a weak body defense system (immune system).  You do a lot of swimming or diving.  You use nasal sprays too much.  You smoke. What are the signs or symptoms? The main symptoms of this condition are pain and a feeling of pressure around the sinuses. Other symptoms include:  Stuffy nose (congestion).  Runny nose (drainage).  Swelling and warmth in the sinuses.  Headache.  Toothache.  A cough that may get worse at night.  Mucus that collects in the throat or the back of the nose (postnasal drip).  Being unable to smell and taste.  Being very tired (fatigue).  A fever.  Sore throat.  Bad breath. How is this diagnosed? This condition is diagnosed based on:  Your symptoms.  Your medical history.  A physical exam.  Tests to find out if your condition is short-term (acute) or long-term (chronic). Your doctor may: ? Check your nose for growths (polyps). ? Check your sinuses using a tool that has a light (endoscope). ? Check for allergies or germs. ? Do imaging tests, such as an MRI or CT scan. How is this  treated? Treatment for this condition depends on the cause and whether it is short-term or long-term.  If caused by a virus, your symptoms should go away on their own within 10 days. You may be given medicines to relieve symptoms. They include: ? Medicines that shrink swollen tissue in the nose. ? Medicines that treat allergies (antihistamines). ? A spray that treats swelling of the nostrils. ? Rinses that help get rid of thick mucus in your nose (nasal saline washes).  If caused by bacteria, your doctor may wait to see if you will get better without treatment. You may be given antibiotic medicine if you have: ? A very bad infection. ? A weak body defense system.  If caused by growths in the nose, you may need to have surgery. Follow these instructions at home: Medicines  Take, use, or apply over-the-counter and prescription medicines only as told by your doctor. These may include nasal sprays.  If you were prescribed an antibiotic medicine, take it as told by your doctor. Do not stop taking the antibiotic even if you start to feel better. Hydrate and humidify   Drink enough water to keep your pee (urine) pale yellow.  Use a cool mist humidifier to keep the humidity level in your home above 50%.  Breathe in steam for 10-15 minutes, 3-4 times a day, or as told by your doctor. You can do this in the bathroom while a hot shower is running.  Try not to spend time  in cool or dry air. Rest  Rest as much as you can.  Sleep with your head raised (elevated).  Make sure you get enough sleep each night. General instructions   Put a warm, moist washcloth on your face 3-4 times a day, or as often as told by your doctor. This will help with discomfort.  Wash your hands often with soap and water. If there is no soap and water, use hand sanitizer.  Do not smoke. Avoid being around people who are smoking (secondhand smoke).  Keep all follow-up visits as told by your doctor. This is  important. Contact a doctor if:  You have a fever.  Your symptoms get worse.  Your symptoms do not get better within 10 days. Get help right away if:  You have a very bad headache.  You cannot stop throwing up (vomiting).  You have very bad pain or swelling around your face or eyes.  You have trouble seeing.  You feel confused.  Your neck is stiff.  You have trouble breathing. Summary  Sinusitis is swelling of your sinuses. Sinuses are hollow spaces in the bones around your face.  This condition is caused by tissues in your nose that become inflamed or swollen. This traps germs. These can lead to infection.  If you were prescribed an antibiotic medicine, take it as told by your doctor. Do not stop taking it even if you start to feel better.  Keep all follow-up visits as told by your doctor. This is important. This information is not intended to replace advice given to you by your health care provider. Make sure you discuss any questions you have with your health care provider. Document Released: 03/29/2008 Document Revised: 03/13/2018 Document Reviewed: 03/13/2018 Elsevier Interactive Patient Education  2019 [employer].

## 2019-01-02 NOTE — Progress Notes (Signed)
l °

## 2019-01-03 ENCOUNTER — Encounter: Payer: Self-pay | Admitting: Physician Assistant

## 2019-01-03 DIAGNOSIS — J302 Other seasonal allergic rhinitis: Secondary | ICD-10-CM | POA: Insufficient documentation

## 2019-01-03 NOTE — Progress Notes (Signed)
   Subjective:    Patient ID: Drew Sutton, male    DOB: [birth year], 64 y.o.   MRN: [id number]  HPI Pt is a 63 yo male with seasonal allergies, allergic rhinitis and recurrent sinusitis who presents to the clinic with worsening sinus pressure, facial pain and headache today. He usually gets a few sinus infections a year and only comes in when he can not take it any more. No fever, chills, body aches, travel. No other close sick contacts. He cannot take steroids in any form due to eye issues. He uses allegra but does not feel like it works. He did not feel like astelin worked very well. He is taking some OTC sudafed combinations with some relief.   ... Active Ambulatory Problems    Diagnosis Date Noted  . Allergic rhinitis 08/02/2006  . ALLERGY, FOOD 09/05/2006  . HEADACHE, UNSPECIFIED 08/02/2006  . Hypothyroid 04/13/2011  . External hemorrhoid 03/24/2012  . Colon polyps 03/24/2012  . Retinitis 08/29/2015  . Sciatica 01/06/2016  . Seasonal allergies 01/03/2019   Resolved Ambulatory Problems    Diagnosis Date Noted  . SINUSITIS, ACUTE 08/15/2006  . DYSPEPSIA 09/05/2006  . INGUINAL HERNIA, LEFT 06/19/2007   Past Medical History:  Diagnosis Date  . Allergy   . Gluten enteropathy         Review of Systems See HPI.     Objective:   Physical Exam Vitals signs reviewed.  Constitutional:      Appearance: Normal appearance.  HENT:     Head: Normocephalic and atraumatic.     Right Ear: Tympanic membrane and ear canal normal.     Left Ear: Tympanic membrane and ear canal normal.     Nose: Congestion present.     Mouth/Throat:     Pharynx: Oropharynx is clear.  Cardiovascular:     Rate and Rhythm: Normal rate and regular rhythm.     Pulses: Normal pulses.  Pulmonary:     Effort: Pulmonary effort is normal.     Breath sounds: No wheezing or rhonchi.  Neurological:     General: No focal deficit present.     Mental Status: He is alert and oriented to person, place,  and time.  Psychiatric:        Mood and Affect: Mood normal.        Behavior: Behavior normal.           Assessment & Plan:  Marland KitchenMarland KitchenRoland was seen today for sinusitis.  Diagnoses and all orders for this visit:  Acute recurrent pansinusitis -     doxycycline (VIBRA-TABS) 100 MG tablet; Take 1 tablet (100 mg total) by mouth 2 (two) times daily.  Non-seasonal allergic rhinitis, unspecified trigger -     ipratropium (ATROVENT) 0.06 % nasal spray; Place 2 sprays into both nostrils 4 (four) times daily. -     cetirizine-pseudoephedrine (ZYRTEC-D) 5-120 MG tablet; Take 1 tablet by mouth 2 (two) times daily.  Seasonal allergies -     ipratropium (ATROVENT) 0.06 % nasal spray; Place 2 sprays into both nostrils 4 (four) times daily. -     cetirizine-pseudoephedrine (ZYRTEC-D) 5-120 MG tablet; Take 1 tablet by mouth 2 (two) times daily.   Treated for sinus infection. Will try zyrtec D for allergies. Added atrovent to try. Last abx was sept 2019. Consider ENT if having more frequent exacerbations.

## 2019-01-10 ENCOUNTER — Encounter: Payer: Self-pay | Admitting: Physician Assistant

## 2019-01-11 MED ORDER — AZITHROMYCIN 250 MG PO TABS: ORAL_TABLET | ORAL | 0 refills | Status: AC

## 2019-05-01 ENCOUNTER — Other Ambulatory Visit: Payer: Self-pay

## 2019-05-01 ENCOUNTER — Ambulatory Visit (INDEPENDENT_AMBULATORY_CARE_PROVIDER_SITE_OTHER): Payer: Self-pay | Admitting: Family Medicine

## 2019-05-01 ENCOUNTER — Encounter: Payer: Self-pay | Admitting: Family Medicine

## 2019-05-01 VITALS — BP 127/78 | HR 84 | Temp 98.3°F | Ht 73.0 in | Wt 245.0 lb

## 2019-05-01 DIAGNOSIS — J019 Acute sinusitis, unspecified: Secondary | ICD-10-CM

## 2019-05-01 DIAGNOSIS — R5383 Other fatigue: Secondary | ICD-10-CM

## 2019-05-01 DIAGNOSIS — R972 Elevated prostate specific antigen [PSA]: Secondary | ICD-10-CM

## 2019-05-01 DIAGNOSIS — E039 Hypothyroidism, unspecified: Secondary | ICD-10-CM

## 2019-05-01 MED ORDER — AMOXICILLIN-POT CLAVULANATE 875-125 MG PO TABS: 1.0000 | ORAL_TABLET | Freq: Two times a day (BID) | ORAL | 0 refills | Status: DC

## 2019-05-01 NOTE — Progress Notes (Signed)
Pt reports that he has sinus pain and pressure, eyes are watery,itchty. When he coughs or blows his nose it has some color. He denies and f/s/c/n/v.   He has been taking some OTC sinus/headache medication which has helped.Elouise Munroe, [place]

## 2019-05-01 NOTE — Progress Notes (Signed)
Acute Office Visit  Subjective:    Patient ID: Drew Sutton, male    DOB: February 21, 1956, 63 y.o.   MRN: [id number]  No chief complaint on file.    HPI Patient is in today for sinus pressure and congestion x 1 week. Has been taking OTC HA and sinus medication. No fever, sweats, chills. No N/V. Has had watery itchy eyes and pressure behind his eyes.   The over-the-counter medication does seem to help temporarily.  Is a prior history of several sinus infections.  He also reports that he is actually not work for the last 2 years because of significant fatigue levels.  He says even just getting a shower seems to be an effort.  Is really made some major efforts to hydrate well and really change his diet.  He is eating more lean proteins and has decreased the amount.  He is really increased his fruit and vegetables as well.  He says his wife has noticed a difference in his energy levels but he has not noticed a significant difference  Past Medical History:  Diagnosis Date  . Allergy    vingear  . Gluten enteropathy     Past Surgical History:  Procedure Laterality Date  . INGUINAL HERNIA REPAIR  2008. 2010   Bilateral    Family History  Problem Relation Age of Onset  . Heart attack Father 42  . Heart disease Mother 8  . Diabetes Mother   . Cervical cancer Mother   . Breast cancer Sister     Social History   Socioeconomic History  . Marital status: Married    Spouse name: Not on file  . Number of children: 2  . Years of education: Not on file  . Highest education level: Not on file  Occupational History  . Occupation: horticulturist    Comment: Works at [employer].   Social Needs  . Financial resource strain: Not on file  . Food insecurity    Worry: Not on file    Inability: Not on file  . Transportation needs    Medical: Not on file    Non-medical: Not on file  Tobacco Use  . Smoking status: Never Smoker  . Smokeless tobacco: Never Used  Substance and  Sexual Activity  . Alcohol use: No  . Drug use: No    Comment: married, 2 children, plays golf, self employed as a Development worker, international aid  . Sexual activity: Yes    Partners: Female  Lifestyle  . Physical activity    Days per week: Not on file    Minutes per session: Not on file  . Stress: Not on file  Relationships  . Social Herbalist on phone: Not on file    Gets together: Not on file    Attends religious service: Not on file    Active member of club or organization: Not on file    Attends meetings of clubs or organizations: Not on file    Relationship status: Not on file  . Intimate partner violence    Fear of current or ex partner: Not on file    Emotionally abused: Not on file    Physically abused: Not on file    Forced sexual activity: Not on file  Other Topics Concern  . Not on file  Social History Narrative   Regular exercise. Has 2 sons. Drinks 32 ounces of soda per day.     Outpatient Medications Prior to Visit  Medication Sig  Dispense Refill  . ipratropium (ATROVENT) 0.06 % nasal spray Place 2 sprays into both nostrils 4 (four) times daily. 15 mL 12  . thyroid (ARMOUR THYROID) 90 MG tablet TAKE 1 TABLET (90 MG TOTAL) BY MOUTH DAILY. 90 tablet 1  . azelastine (ASTELIN) 0.1 % nasal spray Place 1 spray into both nostrils 2 (two) times daily. 30 mL 11  . cetirizine-pseudoephedrine (ZYRTEC-D) 5-120 MG tablet Take 1 tablet by mouth 2 (two) times daily. 60 tablet 11  . doxycycline (VIBRA-TABS) 100 MG tablet Take 1 tablet (100 mg total) by mouth 2 (two) times daily. 20 tablet 0  . traMADol (ULTRAM) 50 MG tablet TAKE 1 TABLET BY MOUTH EVERY 8 HRS AS NEEDED FOR PAIN 30 tablet 0   No facility-administered medications prior to visit.     Allergies  Allergen Reactions  . Nsaids Other (See Comments)    Eye condition, retinitis  . Prednisone     Eye condition    ROS     Objective:    Physical Exam  Constitutional: He is oriented to person, place, and time. He  appears well-developed and well-nourished.  HENT:  Head: Normocephalic and atraumatic.  Right Ear: External ear normal.  Left Ear: External ear normal.  Nose: Nose normal.  Mouth/Throat: Oropharynx is clear and moist.  TMs and canals are clear.   Eyes: Pupils are equal, round, and reactive to light. Conjunctivae and EOM are normal.  Neck: Neck supple. No thyromegaly present.  Cardiovascular: Normal rate, regular rhythm and normal heart sounds.  Pulmonary/Chest: Effort normal and breath sounds normal.  Lymphadenopathy:    He has no cervical adenopathy.  Neurological: He is alert and oriented to person, place, and time.  Skin: Skin is warm and dry.  Psychiatric: He has a normal mood and affect. His behavior is normal.    BP 127/78   Pulse 84   Temp 98.3 F (36.8 C)   Ht 6\' 1"  (1.854 m)   Wt 245 lb (111.1 kg)   SpO2 97%   BMI 32.32 kg/m  Wt Readings from Last 3 Encounters:  05/01/19 245 lb (111.1 kg)  01/02/19 244 lb (110.7 kg)  07/13/18 214 lb 3.2 oz (97.2 kg)    Health Maintenance Due  Topic Date Due  . COLONOSCOPY  08/22/2009    There are no preventive care reminders to display for this patient.   Lab Results  Component Value Date   TSH 0.93 07/13/2018   Lab Results  Component Value Date   WBC 7.9 04/11/2017   HGB 15.4 04/11/2017   HCT 46.6 04/11/2017   MCV 87.8 04/11/2017   PLT 370 04/11/2017   Lab Results  Component Value Date   NA 140 04/11/2017   K 4.2 04/11/2017   CO2 27 04/11/2017   GLUCOSE 96 04/11/2017   BUN 16 04/11/2017   CREATININE 1.07 04/11/2017   BILITOT 0.5 04/11/2017   ALKPHOS 86 04/11/2017   AST 10 04/11/2017   ALT 12 04/11/2017   PROT 6.2 04/11/2017   ALBUMIN 4.0 04/11/2017   CALCIUM 9.2 04/11/2017   Lab Results  Component Value Date   CHOL 172 08/29/2015   Lab Results  Component Value Date   HDL 42 08/29/2015   Lab Results  Component Value Date   LDLCALC 94 08/29/2015   Lab Results  Component Value Date   TRIG 178  (H) 08/29/2015   Lab Results  Component Value Date   CHOLHDL 4.1 08/29/2015   No results found for:  HGBA1C     Assessment & Plan:   Problem List Items Addressed This Visit      Endocrine   Hypothyroid - Primary    To recheck thyroid level.  He says is taking his medication consistently.      Relevant Orders   TSH    Other Visit Diagnoses    Elevated PSA       Relevant Orders   PSA   Acute non-recurrent sinusitis, unspecified location       Relevant Medications   amoxicillin-clavulanate (AUGMENTIN) 875-125 MG tablet   Fatigue, unspecified type       Relevant Orders   COMPLETE METABOLIC PANEL WITH GFR   Lipid panel   TSH   CBC   PSA     Acute sinusitis-we will treat with Augmentin.  If not significantly better in 1 week then please give [place] a call back.  Fatigue-I really like to do more extensive work-up but unfortunately he is self-pay.  I would like to at least check his thyroid just to make sure that the dose he is currently taking seems to be effective and also check a CBC to evaluate for anemia as well as kidney and liver function.  Meds ordered this encounter  Medications  . amoxicillin-clavulanate (AUGMENTIN) 875-125 MG tablet    Sig: Take 1 tablet by mouth 2 (two) times daily.    Dispense:  20 tablet    Refill:  0     Beatrice Lecher, MD

## 2019-05-01 NOTE — Assessment & Plan Note (Signed)
To recheck thyroid level.  He says is taking his medication consistently.

## 2019-05-02 LAB — CBC
HCT: 45.5 % (ref 38.5–50.0)
Hemoglobin: 15.7 g/dL (ref 13.2–17.1)
MCH: 29.7 pg (ref 27.0–33.0)
MCHC: 34.5 g/dL (ref 32.0–36.0)
MCV: 86.2 fL (ref 80.0–100.0)
MPV: 12.2 fL (ref 7.5–12.5)
Platelets: 220 10*3/uL (ref 140–400)
RBC: 5.28 10*6/uL (ref 4.20–5.80)
RDW: 12.6 % (ref 11.0–15.0)
WBC: 6.5 10*3/uL (ref 3.8–10.8)

## 2019-05-02 LAB — COMPLETE METABOLIC PANEL WITH GFR
AG Ratio: 1.8 (calc) (ref 1.0–2.5)
ALT: 19 U/L (ref 9–46)
AST: 17 U/L (ref 10–35)
Albumin: 4.3 g/dL (ref 3.6–5.1)
Alkaline phosphatase (APISO): 88 U/L (ref 35–144)
BUN: 14 mg/dL (ref 7–25)
CO2: 28 mmol/L (ref 20–32)
Calcium: 9.4 mg/dL (ref 8.6–10.3)
Chloride: 103 mmol/L (ref 98–110)
Creat: 1.21 mg/dL (ref 0.70–1.25)
GFR, Est African American: 73 mL/min/{1.73_m2} (ref >=60)
GFR, Est Non African American: 63 mL/min/{1.73_m2} (ref >=60)
Globulin: 2.4 g/dL (calc) (ref 1.9–3.7)
Glucose, Bld: 89 mg/dL (ref 65–99)
Potassium: 4.3 mmol/L (ref 3.5–5.3)
Sodium: 139 mmol/L (ref 135–146)
Total Bilirubin: 0.7 mg/dL (ref 0.2–1.2)
Total Protein: 6.7 g/dL (ref 6.1–8.1)

## 2019-05-02 LAB — LIPID PANEL
Cholesterol: 180 mg/dL (ref <200)
HDL: 36 mg/dL — ABNORMAL LOW (ref >=40)
LDL Cholesterol (Calc): 121 mg/dL (calc) — ABNORMAL HIGH
Non-HDL Cholesterol (Calc): 144 mg/dL (calc) — ABNORMAL HIGH (ref <130)
Total CHOL/HDL Ratio: 5 (calc) — ABNORMAL HIGH (ref <5.0)
Triglycerides: 124 mg/dL (ref <150)

## 2019-05-02 LAB — TSH: TSH: 4.14 mIU/L (ref 0.40–4.50)

## 2019-05-02 LAB — PSA: PSA: 4.2 ng/mL — ABNORMAL HIGH (ref <=4.0)

## 2019-05-03 ENCOUNTER — Other Ambulatory Visit: Payer: Self-pay | Admitting: Family Medicine

## 2019-11-19 ENCOUNTER — Encounter: Payer: Self-pay | Admitting: Nurse Practitioner

## 2019-11-19 ENCOUNTER — Ambulatory Visit (INDEPENDENT_AMBULATORY_CARE_PROVIDER_SITE_OTHER): Payer: Self-pay | Admitting: Nurse Practitioner

## 2019-11-19 DIAGNOSIS — J329 Chronic sinusitis, unspecified: Secondary | ICD-10-CM

## 2019-11-19 DIAGNOSIS — B9689 Other specified bacterial agents as the cause of diseases classified elsewhere: Secondary | ICD-10-CM

## 2019-11-19 MED ORDER — HYDROCODONE-HOMATROPINE 5-1.5 MG/5ML PO SYRP: 5.0000 mL | ORAL_SOLUTION | Freq: Four times a day (QID) | ORAL | 0 refills | Status: AC | PRN

## 2019-11-19 MED ORDER — AMOXICILLIN-POT CLAVULANATE 875-125 MG PO TABS: 1.0000 | ORAL_TABLET | Freq: Two times a day (BID) | ORAL | 0 refills | Status: AC

## 2019-11-19 NOTE — Progress Notes (Addendum)
Virtual Visit via Telephone Note  I connected with Drew Sutton on 11/19/19 at  4:20 PM EST by telephone and verified that I am speaking with the correct person using two identifiers.   I discussed the limitations, risks, security and privacy concerns of performing an evaluation and management service by telephone and the availability of in person appointments. I also discussed with the patient that there may be a patient responsible charge related to this service. The patient expressed understanding and agreed to proceed.  The patient is at home I am in the office  Subjective:    CC: Sinusitis  HPI: Mr. Drew Sutton presents today via telephone visit for symptoms of sinusitis.  The patient reports that he has chronic sinusitis and is consistently having symptoms of congestion, facial pain and pressure, and cough.  Approximately 3 weeks ago his symptoms began to escalate.  He reports they are now to the point that he typically requires antibiotic therapy. He is experiencing decreased energy, dizziness, productive cough, chest congestion, decreased appetite, eye pain and pressure, ear pain and pressure, headache, shortness of breath, and chills.  He reports his symptoms are worse first thing in the morning. He is having difficulty resting at night and finds the cough to be very bothersome.   He does report that his wife started developing symptoms of COVID-19 infection on Friday and progressively got worse through the weekend. She has been quarantined in a different area of the house and they have not been in any contact.   UPPER RESPIRATORY TRACT INFECTION Worst symptom: Cough Fever: feverish Cough: yes Shortness of breath: yes Wheezing: no Chest pain: yes, with cough Chest tightness: no Chest congestion: yes Nasal congestion: yes Runny nose: yes Post nasal drip: yes Sneezing: no Sore throat: yes Swollen glands: no Sinus pressure: yes Headache: yes Face pain:  yes Toothache: yes Ear pain: yes bilateral Ear pressure: yes bilateral Eyes red/itching:yes Eye drainage/crusting: no  Vomiting: no Rash: no Fatigue: yes Sick contacts: yes Strep contacts: no  Context: worse Recurrent sinusitis: yes Relief with OTC cold/cough medications: no    Past medical history, Surgical history, Family history not pertinant except as noted below, Social history, Allergies, and medications have been entered into the medical record, reviewed, and corrections made.   Review of Systems:  No night sweats, weight loss, chest pain.  No loss of taste or smell.  No nausea, vomiting, diarrhea.    Objective:    General: Speaking clearly in complete sentences without any shortness of breath.  Alert and oriented x3.  Normal judgment. No apparent acute distress.  Patient is congested and frequent cough.   Impression and Recommendations:    1. Bacterial sinusitis Bacterial infection in the setting of chronic sinusitis.  Patient is also living in the same household as his wife who is suspected Covid positive, however, they have been quarantined from each other since the onset of her symptoms.  Educated the patient on symptoms of COVID-19 and to keep [place] informed if his symptoms escalate.  Instructed the patient for symptoms that would warrant emergency evaluation such as shortness of breath at rest, altered mental status, or dizziness.  Prescription for Augmentin and Hycodan sent.  Patient to follow-up if symptoms worsen or fail to improve. - amoxicillin-clavulanate (AUGMENTIN) 875-125 MG tablet; Take 1 tablet by mouth 2 (two) times daily.  Dispense: 20 tablet; Refill: 0 - HYDROcodone-homatropine (HYCODAN) 5-1.5 MG/5ML syrup; Take 5 mLs by mouth every 6 (six) hours as needed for cough.  Dispense: 120 mL; Refill: 0     I discussed the assessment and treatment plan with the patient. The patient was provided an opportunity to ask questions and all were answered. The patient  agreed with the plan and demonstrated an understanding of the instructions.   The patient was advised to call back or seek an in-person evaluation if the symptoms worsen or if the condition fails to improve as anticipated.   25 minutes spent with chart review, paperwork, and patient visit.  Telephone Visit  Orma Render, NP

## 2019-11-19 NOTE — Patient Instructions (Signed)
Symptom Management for Sinusitis  Congestion: Guaifenesin (Mucinex)- follow directions on packaging with a maximum dose of 2400mg  in a 24 hour period.  Pain/Fever: Ibuprofen 200mg  - 400mg  every 4-6 hours as needed (MAX 1200mg  in a 24 hour period) Pain/Fever: Tylenol 500mg  -1000mg  every 6-8 hours as needed (MAX 3000mg  in a 24 hour period)  Cough: Dextromethorphan (Delsym)- follow directions on packing with a maximum dose of 120mg  in a 24 hour period.  Nasal Stuffiness: Saline nasal spray and/or Nettie Pot with sterile saline solution  Runny Nose: Fluticasone nasal spray (Flonase) OR Mometasone nasal spray (Nasonex) OR Triamcinolone Acetonide nasal spray (Nasacort)- follow directions on the packaging  Pain/Pressure: Warm washcloth to the face  Sore Throat: Warm salt water gargles  If you have allergies, you may also consider taking an oral antihistamine (like Zyrtec or Claritin) as these may also help with your symptoms.  **Many medications will have more than one ingredient, be sure you are reading the packaging carefully and not taking more than one dose of the same kind of medication at the same time or too close together. It is OK to use formulas that have all of the ingredients you want, but do not take them in a combined medication and as separate dose too close together. If you have any questions, the pharmacist will be happy to help you decide what is safe.

## 2020-04-25 ENCOUNTER — Telehealth: Payer: Self-pay | Admitting: Family Medicine

## 2020-04-25 NOTE — Telephone Encounter (Signed)
Call pt: due for colon ca screening. Did he ever have the repeat scope done? I saw we referred I=hinm several years ago but I didn't see the report.

## 2020-04-30 NOTE — Telephone Encounter (Signed)
Spoke w/pt and he did not get this done and stated that he will have this done when he gets Medicare in the next few months.

## 2020-05-05 ENCOUNTER — Other Ambulatory Visit: Payer: Self-pay | Admitting: Family Medicine

## 2020-05-29 ENCOUNTER — Other Ambulatory Visit: Payer: Self-pay | Admitting: Family Medicine

## 2020-06-27 ENCOUNTER — Other Ambulatory Visit: Payer: Self-pay | Admitting: Family Medicine

## 2020-07-28 ENCOUNTER — Other Ambulatory Visit: Payer: Self-pay | Admitting: Family Medicine

## 2020-09-25 ENCOUNTER — Other Ambulatory Visit: Payer: Self-pay | Admitting: Family Medicine

## 2020-12-08 ENCOUNTER — Telehealth: Payer: Self-pay | Admitting: General Practice

## 2020-12-12 NOTE — Telephone Encounter (Signed)
Documentation only.

## 2021-03-25 DIAGNOSIS — H2513 Age-related nuclear cataract, bilateral: Secondary | ICD-10-CM | POA: Diagnosis not present
